# Patient Record
Sex: Female | Born: 1985 | Race: Black or African American | Hispanic: No | Marital: Married | Smoking: Never smoker
Health system: Southern US, Community
[De-identification: ages and names within clinical notes are randomized; demographics above are authoritative.]

## PROBLEM LIST (undated history)

## (undated) ENCOUNTER — Inpatient Hospital Stay (HOSPITAL_COMMUNITY): Payer: Self-pay

## (undated) DIAGNOSIS — E669 Obesity, unspecified: Secondary | ICD-10-CM

## (undated) HISTORY — PX: TONSILLECTOMY: SUR1361

---

## 2004-10-05 ENCOUNTER — Ambulatory Visit (HOSPITAL_COMMUNITY): Admission: RE | Admit: 2004-10-05 | Discharge: 2004-10-05 | Payer: Self-pay | Admitting: Plastic Surgery

## 2004-10-05 ENCOUNTER — Ambulatory Visit (HOSPITAL_BASED_OUTPATIENT_CLINIC_OR_DEPARTMENT_OTHER): Admission: RE | Admit: 2004-10-05 | Discharge: 2004-10-05 | Payer: Self-pay | Admitting: Plastic Surgery

## 2004-10-06 ENCOUNTER — Encounter (INDEPENDENT_AMBULATORY_CARE_PROVIDER_SITE_OTHER): Payer: Self-pay | Admitting: *Deleted

## 2005-02-12 ENCOUNTER — Other Ambulatory Visit: Admission: RE | Admit: 2005-02-12 | Discharge: 2005-02-12 | Payer: Self-pay | Admitting: Obstetrics and Gynecology

## 2009-03-22 ENCOUNTER — Emergency Department (HOSPITAL_COMMUNITY): Admission: EM | Admit: 2009-03-22 | Discharge: 2009-03-22 | Payer: Self-pay | Admitting: Emergency Medicine

## 2010-01-10 ENCOUNTER — Ambulatory Visit (HOSPITAL_BASED_OUTPATIENT_CLINIC_OR_DEPARTMENT_OTHER): Admission: RE | Admit: 2010-01-10 | Discharge: 2010-01-10 | Payer: Self-pay | Admitting: Otolaryngology

## 2010-09-24 HISTORY — PX: OTHER SURGICAL HISTORY: SHX169

## 2011-01-01 LAB — URINALYSIS, ROUTINE W REFLEX MICROSCOPIC
Glucose, UA: NEGATIVE mg/dL
Nitrite: NEGATIVE
Protein, ur: NEGATIVE mg/dL
pH: 6 (ref 5.0–8.0)

## 2011-01-01 LAB — DIFFERENTIAL
Basophils Relative: 0 % (ref 0–1)
Lymphocytes Relative: 15 % (ref 12–46)
Lymphs Abs: 1.3 10*3/uL (ref 0.7–4.0)
Monocytes Absolute: 0.5 10*3/uL (ref 0.1–1.0)
Monocytes Relative: 6 % (ref 3–12)
Neutro Abs: 7.1 10*3/uL (ref 1.7–7.7)
Neutrophils Relative %: 80 % — ABNORMAL HIGH (ref 43–77)

## 2011-01-01 LAB — COMPREHENSIVE METABOLIC PANEL
Albumin: 3.7 g/dL (ref 3.5–5.2)
Alkaline Phosphatase: 126 U/L — ABNORMAL HIGH (ref 39–117)
BUN: 9 mg/dL (ref 6–23)
Calcium: 8.8 mg/dL (ref 8.4–10.5)
Creatinine, Ser: 0.94 mg/dL (ref 0.4–1.2)
Glucose, Bld: 112 mg/dL — ABNORMAL HIGH (ref 70–99)
Total Protein: 7.7 g/dL (ref 6.0–8.3)

## 2011-01-01 LAB — CBC
HCT: 34.2 % — ABNORMAL LOW (ref 36.0–46.0)
Hemoglobin: 10.9 g/dL — ABNORMAL LOW (ref 12.0–15.0)
MCHC: 31.8 g/dL (ref 30.0–36.0)
MCV: 76.1 fL — ABNORMAL LOW (ref 78.0–100.0)
Platelets: 371 10*3/uL (ref 150–400)
RDW: 15.2 % (ref 11.5–15.5)

## 2011-01-01 LAB — POCT PREGNANCY, URINE: Preg Test, Ur: NEGATIVE

## 2011-01-01 LAB — URINE MICROSCOPIC-ADD ON

## 2011-02-09 NOTE — Op Note (Signed)
NAMECAMILIA, Miranda Huynh            ACCOUNT NO.:  [id number]   MEDICAL RECORD NO.:  [id number]          PATIENT TYPE:  AMB   LOCATION:  DSC                          FACILITY:  [facility]   PHYSICIAN:  Consuello Bossier., M.D.DATE OF BIRTH:  October 12, 1985   DATE OF PROCEDURE:  10/05/2004  DATE OF DISCHARGE:                                 OPERATIVE REPORT   PREOPERATIVE DIAGNOSIS:  Symptomatic bilateral mammary hypertrophy.   POSTOPERATIVE DIAGNOSIS:  Symptomatic bilateral mammary hypertrophy.   OPERATION:  Bilateral reduction mammoplasty.   SURGEON:  Pleas Patricia, M.D.   ASSISTANT:  Etter Sjogren, M.D.   ANESTHESIA:  General endotracheal anesthesia.   FINDINGS:  The patient had symptomatic bilateral mammary hypertrophy with  discomfort to her chest, upper shoulder and back area.  A bilateral  reduction mammoplasty was performed removing in excess of 800 g from the  right breast, in excess of 900 g from the left breast, for a total of  approximately 1800 g.   DESCRIPTION OF PROCEDURE:  The patient was brought to the operating room  having been marked in the upright position, supine for the surgical  procedure.  She was given general endotracheal anesthesia, prepped with  Betadine and draped about both dressed in sterile fashion.  Initially, the  key hole area as well as the inferior pedicle were deepithelialized.  An  inframammary incision was made and continued down to the pectoralis major  muscle then upward toward the new nipple position.  Similar procedure was  carried out laterally thereby creating the vertical bipedicle nipple areolar  graft.  An incision in the central pedicle was made just above the current  nipple position and down the pectoralis major muscle then upward, leaving a  1 cm in depth superior pedicle.  A large triangular segment of medial full  thickness breast tissue was removed in continuity with this central segment  as well as with a larger lateral  segment.  Bleeding was controlled with  electrocautery and the wound was irrigated with normal saline and there was  noted to be good hemostasis.  The medial and lateral flaps were brought  together to a predetermined position along the inframammary line with  interrupted 2-0 Vicryl.  The circumareolar, vertical and inframammary  incisions were closed then with interrupted subcutaneous 3-0 Monocryl  followed by a running subcuticular 4-0 Monocryl.  Breasts appeared to be  symmetrical.  Steri-Strips, Xeroform, fluffs, ABD and a circumthoracic Ace  bandage were applied.  The patient tolerated the procedure well and was able  to be discharged from the operating room to the recovery room, subsequently  to be admitted to the [facility] for overnight observation.      Howa   HH/MEDQ  D:  10/05/2004  T:  10/05/2004  Job:  [id number]

## 2011-02-21 ENCOUNTER — Other Ambulatory Visit (HOSPITAL_COMMUNITY): Payer: Self-pay | Admitting: Obstetrics and Gynecology

## 2011-02-21 DIAGNOSIS — N979 Female infertility, unspecified: Secondary | ICD-10-CM

## 2011-02-23 ENCOUNTER — Ambulatory Visit (HOSPITAL_COMMUNITY): Admission: RE | Admit: 2011-02-23 | Payer: Commercial Managed Care - PPO | Source: Ambulatory Visit

## 2011-02-23 ENCOUNTER — Ambulatory Visit (HOSPITAL_COMMUNITY)
Admission: RE | Admit: 2011-02-23 | Discharge: 2011-02-23 | Disposition: A | Discharge status: Home / Self Care | Payer: 59 | Source: Ambulatory Visit | Attending: Obstetrics and Gynecology | Admitting: Obstetrics and Gynecology

## 2011-02-23 DIAGNOSIS — N979 Female infertility, unspecified: Secondary | ICD-10-CM | POA: Insufficient documentation

## 2011-08-05 ENCOUNTER — Encounter (HOSPITAL_COMMUNITY): Payer: Self-pay | Admitting: *Deleted

## 2011-08-05 ENCOUNTER — Inpatient Hospital Stay (HOSPITAL_COMMUNITY)
Admission: AD | Admit: 2011-08-05 | Discharge: 2011-08-05 | Disposition: A | Discharge status: Home / Self Care | Payer: 59 | Source: Ambulatory Visit | Attending: Obstetrics & Gynecology | Admitting: Obstetrics & Gynecology

## 2011-08-05 DIAGNOSIS — O210 Mild hyperemesis gravidarum: Secondary | ICD-10-CM | POA: Insufficient documentation

## 2011-08-05 LAB — CBC
HCT: 36.3 % (ref 36.0–46.0)
Platelets: 425 10*3/uL — ABNORMAL HIGH (ref 150–400)
RBC: 4.9 MIL/uL (ref 3.87–5.11)
RDW: 15.3 % (ref 11.5–15.5)
WBC: 7.7 10*3/uL (ref 4.0–10.5)

## 2011-08-05 LAB — URINALYSIS, ROUTINE W REFLEX MICROSCOPIC
Glucose, UA: NEGATIVE mg/dL
Leukocytes, UA: NEGATIVE
Protein, ur: 30 mg/dL — AB
Specific Gravity, Urine: >1.03 — ABNORMAL HIGH (ref 1.005–1.030)

## 2011-08-05 LAB — COMPREHENSIVE METABOLIC PANEL
AST: 26 U/L (ref 0–37)
Albumin: 3.7 g/dL (ref 3.5–5.2)
Alkaline Phosphatase: 107 U/L (ref 39–117)
BUN: 11 mg/dL (ref 6–23)
CO2: 23 mEq/L (ref 19–32)
Chloride: 99 mEq/L (ref 96–112)
Potassium: 3.7 mEq/L (ref 3.5–5.1)
Total Bilirubin: 0.4 mg/dL (ref 0.3–1.2)

## 2011-08-05 MED ORDER — PROMETHAZINE HCL 25 MG PO TABS: 25.0000 mg | ORAL_TABLET | Freq: Four times a day (QID) | ORAL | Status: AC | PRN

## 2011-08-05 MED ORDER — PROMETHAZINE HCL 25 MG/ML IJ SOLN
25.0000 mg | Freq: Once | INTRAMUSCULAR | Status: AC
  Administered 2011-08-05: 25 mg via INTRAVENOUS
  Filled 2011-08-05: qty 1

## 2011-08-05 MED ORDER — LACTATED RINGERS IV BOLUS (SEPSIS)
1000.0000 mL | Freq: Once | INTRAVENOUS | Status: AC
  Administered 2011-08-05: 1000 mL via INTRAVENOUS

## 2011-08-05 NOTE — Progress Notes (Signed)
Pt states, ' I have been vomiting since Thursday, about fifteen times a day. I throw up from the time I get up to the time I go to bed. I haven't ate since Thursday, and even liquids come straight back up. I feel weak and dizzy."

## 2011-08-05 NOTE — ED Provider Notes (Signed)
History   Pt presents today c/o severe N&V. She states her sx have worsened over the past 3 days and she has vomited about 15x per day. She denies fever, vag dc, bleeding, diarrhea, or any other sx at this time.  Chief Complaint  Patient presents with  . Morning Sickness   HPI  OB History    Grav Para Term Preterm Abortions TAB SAB Ect Mult Living   1               History reviewed. No pertinent past medical history.  Past Surgical History  Procedure Date  . Tonsillectomy     2 years ago  . Other surgical history 2012    R Tubal blockage    History reviewed. No pertinent family history.  History  Substance Use Topics  . Smoking status: Never Smoker   . Smokeless tobacco: Never Used  . Alcohol Use: No    Allergies: No Known Allergies  Prescriptions prior to admission  Medication Sig Dispense Refill  . prenatal vitamin w/FE, FA (PRENATAL 1 + 1) 27-1 MG TABS Take 1 tablet by mouth daily.          Review of Systems  Constitutional: Negative for fever.  Cardiovascular: Negative for chest pain and palpitations.  Gastrointestinal: Positive for nausea and vomiting. Negative for abdominal pain, diarrhea and constipation.  Genitourinary: Negative for dysuria, urgency, frequency and hematuria.  Neurological: Negative for dizziness and headaches.  Psychiatric/Behavioral: Negative for depression and suicidal ideas.   Physical Exam   Blood pressure 125/76, pulse 87, temperature 98.4 F (36.9 C), temperature source Oral, resp. rate 20, height 6' 7.25" (2.013 m), weight 275 lb 2 oz (124.796 kg), last menstrual period 06/17/2011.  Physical Exam  Nursing note and vitals reviewed. Constitutional: She is oriented to person, place, and time. She appears well-developed and well-nourished. No distress.  HENT:  Head: Normocephalic and atraumatic.  Eyes: EOM are normal. Pupils are equal, round, and reactive to light.  GI: Soft. She exhibits no distension. There is no tenderness.  There is no rebound and no guarding.  Neurological: She is alert and oriented to person, place, and time.  Skin: Skin is warm and dry. She is not diaphoretic.  Psychiatric: She has a normal mood and affect. Her behavior is normal. Judgment and thought content normal.    MAU Course  Procedures  Results for orders placed during the hospital encounter of 08/05/11 (from the past 24 hour(s))  CBC     Status: Abnormal   Collection Time   08/05/11 12:39 AM      Component Value Range   WBC 7.7  4.0 - 10.5 (K/uL)   RBC 4.90  3.87 - 5.11 (MIL/uL)   Hemoglobin 11.7 (*) 12.0 - 15.0 (g/dL)   HCT 16.1  09.6 - 04.5 (%)   MCV 74.1 (*) 78.0 - 100.0 (fL)   MCH 23.9 (*) 26.0 - 34.0 (pg)   MCHC 32.2  30.0 - 36.0 (g/dL)   RDW 40.9  81.1 - 91.4 (%)   Platelets 425 (*) 150 - 400 (K/uL)  COMPREHENSIVE METABOLIC PANEL     Status: Abnormal   Collection Time   08/05/11 12:39 AM      Component Value Range   Sodium 134 (*) 135 - 145 (mEq/L)   Potassium 3.7  3.5 - 5.1 (mEq/L)   Chloride 99  96 - 112 (mEq/L)   CO2 23  19 - 32 (mEq/L)   Glucose, Bld 84  70 -  99 (mg/dL)   BUN 11  6 - 23 (mg/dL)   Creatinine, Ser 1.61  0.50 - 1.10 (mg/dL)   Calcium 9.6  8.4 - 09.6 (mg/dL)   Total Protein 8.8 (*) 6.0 - 8.3 (g/dL)   Albumin 3.7  3.5 - 5.2 (g/dL)   AST 26  0 - 37 (U/L)   ALT 39 (*) 0 - 35 (U/L)   Alkaline Phosphatase 107  39 - 117 (U/L)   Total Bilirubin 0.4  0.3 - 1.2 (mg/dL)   GFR calc non Af Amer >90  >90 (mL/min)   GFR calc Af Amer >90  >90 (mL/min)  URINALYSIS, ROUTINE W REFLEX MICROSCOPIC     Status: Abnormal   Collection Time   08/05/11 12:41 AM      Component Value Range   Color, Urine YELLOW  YELLOW    Appearance CLEAR  CLEAR    Specific Gravity, Urine >1.030 (*) 1.005 - 1.030    pH 6.0  5.0 - 8.0    Glucose, UA NEGATIVE  NEGATIVE (mg/dL)   Hgb urine dipstick NEGATIVE  NEGATIVE    Bilirubin Urine SMALL (*) NEGATIVE    Ketones, ur 40 (*) NEGATIVE (mg/dL)   Protein, ur 30 (*) NEGATIVE  (mg/dL)   Urobilinogen, UA 0.2  0.0 - 1.0 (mg/dL)   Nitrite NEGATIVE  NEGATIVE    Leukocytes, UA NEGATIVE  NEGATIVE   URINE MICROSCOPIC-ADD ON     Status: Abnormal   Collection Time   08/05/11 12:41 AM      Component Value Range   Squamous Epithelial / LPF FEW (*) RARE    WBC, UA 0-2  <3 (WBC/hpf)   RBC / HPF 0-2  <3 (RBC/hpf)   Bacteria, UA FEW (*) RARE    Pt sx resolved following IV hydration and antiemetics.  Assessment and Plan  Hyperemesis: discussed with pt at length. Will dc with phenergan. Discussed diet, activity, risks, and precautions.  Clinton Gallant. Akeia Perot III, DrHSc, MPAS, PA-C  08/05/2011, 1:34 AM   Henrietta Hoover, PA 08/05/11 0308

## 2011-08-06 LAB — POCT PREGNANCY, URINE: Preg Test, Ur: POSITIVE

## 2011-08-14 ENCOUNTER — Inpatient Hospital Stay (HOSPITAL_COMMUNITY)
Admission: AD | Admit: 2011-08-14 | Discharge: 2011-08-14 | Disposition: A | Discharge status: Home / Self Care | Payer: 59 | Source: Ambulatory Visit | Attending: Obstetrics and Gynecology | Admitting: Obstetrics and Gynecology

## 2011-08-14 ENCOUNTER — Encounter (HOSPITAL_COMMUNITY): Payer: Self-pay | Admitting: *Deleted

## 2011-08-14 DIAGNOSIS — O210 Mild hyperemesis gravidarum: Secondary | ICD-10-CM | POA: Insufficient documentation

## 2011-08-14 DIAGNOSIS — R1110 Vomiting, unspecified: Secondary | ICD-10-CM

## 2011-08-14 LAB — URINALYSIS, ROUTINE W REFLEX MICROSCOPIC
Leukocytes, UA: NEGATIVE
Nitrite: NEGATIVE
Specific Gravity, Urine: 1.025 (ref 1.005–1.030)
pH: 6 (ref 5.0–8.0)

## 2011-08-14 LAB — URINE MICROSCOPIC-ADD ON

## 2011-08-14 MED ORDER — METHYLPREDNISOLONE 4 MG PO KIT: PACK | ORAL | Status: AC

## 2011-08-14 MED ORDER — SCOPOLAMINE 1 MG/3DAYS TD PT72
1.0000 | MEDICATED_PATCH | Freq: Once | TRANSDERMAL | Status: DC
  Administered 2011-08-14: 1.5 mg via TRANSDERMAL
  Filled 2011-08-14: qty 1

## 2011-08-14 MED ORDER — ONDANSETRON HCL 4 MG/2ML IJ SOLN
4.0000 mg | Freq: Once | INTRAMUSCULAR | Status: AC
  Administered 2011-08-14: 4 mg via INTRAVENOUS
  Filled 2011-08-14: qty 2

## 2011-08-14 MED ORDER — FAMOTIDINE IN NACL 20-0.9 MG/50ML-% IV SOLN
20.0000 mg | Freq: Once | INTRAVENOUS | Status: AC
  Administered 2011-08-14: 20 mg via INTRAVENOUS
  Filled 2011-08-14: qty 50

## 2011-08-14 MED ORDER — LACTATED RINGERS IV SOLN
10.0000 mL | Freq: Once | INTRAVENOUS | Status: AC
  Administered 2011-08-14: 10 mL via INTRAVENOUS
  Filled 2011-08-14: qty 10

## 2011-08-14 MED ORDER — PROMETHAZINE HCL 25 MG/ML IJ SOLN
25.0000 mg | Freq: Once | INTRAMUSCULAR | Status: AC
  Administered 2011-08-14: 25 mg via INTRAVENOUS
  Filled 2011-08-14: qty 1

## 2011-08-14 MED ORDER — SCOPOLAMINE 1 MG/3DAYS TD PT72: 1.0000 | MEDICATED_PATCH | TRANSDERMAL | Status: DC

## 2011-08-14 NOTE — Progress Notes (Signed)
Spoke with provider S. Shores, CNM and she wishes for zofran to be given just prior to finishing las bag of fluid.

## 2011-08-14 NOTE — Progress Notes (Signed)
Patient was seen in MAU 11-11 and given IV fluids for N/V. Has Zofran and Phenergan at home but are not working. Had two days without vomiting since last MAU visit. Unable to keep anything down.

## 2011-08-14 NOTE — ED Provider Notes (Signed)
Chief Complaint:  Emesis   Miranda Huynh is  25 y.o. G2P0010.  Patient's last menstrual period was 06/17/2011.Marland Kitchen  Her pregnancy status is positive. [redacted]w[redacted]d  She presents complaining of Emesis . Increased vomiting today despise taking medications as directed. Reports vomiting ~ 6 times today. Denies fever, chills, abd pain, diarrhea, or dizziness. Last Zofran at noon today. Unable to tolerate anything po. Reports last episode of vomiting < 1 hour ago.   Obstetrical/Gynecological History: OB History    Grav Para Term Preterm Abortions TAB SAB Ect Mult Living   2 0 0 0 1 0 1 0 0 0       Past Medical History: History reviewed. No pertinent past medical history.  Past Surgical History: Past Surgical History  Procedure Date  . Tonsillectomy     2 years ago  . Other surgical history 2012    R Tubal blockage    Family History: Family History  Problem Relation Age of Onset  . Cancer Maternal Grandmother     Social History: History  Substance Use Topics  . Smoking status: Never Smoker   . Smokeless tobacco: Never Used  . Alcohol Use: No    Allergies: No Known Allergies  Prescriptions prior to admission  Medication Sig Dispense Refill  . prenatal vitamin w/FE, FA (PRENATAL 1 + 1) 27-1 MG TABS Take 1 tablet by mouth daily.          Review of Systems - Negative except what has been reviewed in the HPI  Physical Exam   Blood pressure 143/83, pulse 91, temperature 98.7 F (37.1 C), temperature source Oral, resp. rate 20, height 5\' 8"  (1.727 m), weight 122.38 kg (269 lb 12.8 oz), last menstrual period 06/17/2011, SpO2 99.00%.  General: General appearance - alert, well appearing, and in no distress, oriented to person, place, and time and overweight Abdomen - soft, nontender, nondistended, no masses or organomegaly Focused Gynecological Exam: examination not indicated  Labs: Recent Results (from the past 24 hour(s))  URINALYSIS, ROUTINE W REFLEX MICROSCOPIC   Collection Time     08/14/11  5:30 PM      Component Value Range   Color, Urine YELLOW  YELLOW    Appearance CLEAR  CLEAR    Specific Gravity, Urine 1.025  1.005 - 1.030    pH 6.0  5.0 - 8.0    Glucose, UA NEGATIVE  NEGATIVE (mg/dL)   Hgb urine dipstick NEGATIVE  NEGATIVE    Bilirubin Urine MODERATE (*) NEGATIVE    Ketones, ur >80 (*) NEGATIVE (mg/dL)   Protein, ur 30 (*) NEGATIVE (mg/dL)   Urobilinogen, UA 2.0 (*) 0.0 - 1.0 (mg/dL)   Nitrite NEGATIVE  NEGATIVE    Leukocytes, UA NEGATIVE  NEGATIVE   URINE MICROSCOPIC-ADD ON   Collection Time   08/14/11  5:30 PM      Component Value Range   Squamous Epithelial / LPF RARE  RARE    WBC, UA 0-2  <3 (WBC/hpf)   RBC / HPF 0-2  <3 (RBC/hpf)   Bacteria, UA MANY (*) RARE    Urine-Other MUCOUS PRESENT     MD Consult: IVF per protocol, discharge home with scope patch and medrol taper. Tolerating ice chips  Assessment: Hyperemesis   Plan: Rx medrol taper and scope patch given FU in office as scheduled  SHORES,SUZANNE E. 08/14/2011,6:59 PM   Patient discharged to home to follow up in the office.  [place], NP 08/14/11 2200

## 2011-08-14 NOTE — ED Notes (Signed)
States was recently changed to Zofran from Phenergan. States it is the tablets and she can not keep them down. Called MD office, was advised to come to MAU.

## 2011-08-28 ENCOUNTER — Encounter (HOSPITAL_COMMUNITY): Payer: Self-pay | Admitting: *Deleted

## 2011-08-28 ENCOUNTER — Inpatient Hospital Stay (HOSPITAL_COMMUNITY)
Admission: AD | Admit: 2011-08-28 | Discharge: 2011-08-28 | Disposition: A | Discharge status: Home / Self Care | Payer: 59 | Source: Ambulatory Visit | Attending: Obstetrics and Gynecology | Admitting: Obstetrics and Gynecology

## 2011-08-28 DIAGNOSIS — O210 Mild hyperemesis gravidarum: Secondary | ICD-10-CM

## 2011-08-28 DIAGNOSIS — O211 Hyperemesis gravidarum with metabolic disturbance: Secondary | ICD-10-CM | POA: Insufficient documentation

## 2011-08-28 DIAGNOSIS — E860 Dehydration: Secondary | ICD-10-CM | POA: Insufficient documentation

## 2011-08-28 HISTORY — DX: Obesity, unspecified: E66.9

## 2011-08-28 LAB — URINE MICROSCOPIC-ADD ON

## 2011-08-28 LAB — COMPREHENSIVE METABOLIC PANEL
ALT: 46 U/L — ABNORMAL HIGH (ref 0–35)
AST: 24 U/L (ref 0–37)
Albumin: 4 g/dL (ref 3.5–5.2)
CO2: 19 mEq/L (ref 19–32)
Calcium: 11.1 mg/dL — ABNORMAL HIGH (ref 8.4–10.5)
Creatinine, Ser: 0.88 mg/dL (ref 0.50–1.10)
Sodium: 137 mEq/L (ref 135–145)
Total Protein: 9.2 g/dL — ABNORMAL HIGH (ref 6.0–8.3)

## 2011-08-28 LAB — URINALYSIS, ROUTINE W REFLEX MICROSCOPIC
Ketones, ur: >80 mg/dL — AB
Nitrite: NEGATIVE
pH: 6 (ref 5.0–8.0)

## 2011-08-28 MED ORDER — DEXTROSE 5 % IN LACTATED RINGERS IV BOLUS
1000.0000 mL | Freq: Once | INTRAVENOUS | Status: AC
  Administered 2011-08-28: 1000 mL via INTRAVENOUS

## 2011-08-28 MED ORDER — PROMETHAZINE HCL 12.5 MG PO TABS: 12.5000 mg | ORAL_TABLET | Freq: Four times a day (QID) | ORAL | Status: DC | PRN

## 2011-08-28 MED ORDER — ONDANSETRON 4 MG PO TBDP
4.0000 mg | ORAL_TABLET | Freq: Once | ORAL | Status: AC
  Administered 2011-08-28: 4 mg via ORAL
  Filled 2011-08-28: qty 1

## 2011-08-28 MED ORDER — M.V.I. ADULT IV INJ
10.0000 mL | Freq: Once | INTRAVENOUS | Status: DC
  Administered 2011-08-28: 10 mL via INTRAVENOUS

## 2011-08-28 MED ORDER — DEXTROSE IN LACTATED RINGERS 5 % IV SOLN
25.0000 mg | Freq: Once | INTRAVENOUS | Status: AC
  Administered 2011-08-28: 25 mg via INTRAVENOUS
  Filled 2011-08-28: qty 1

## 2011-08-28 MED ORDER — PROMETHAZINE HCL 25 MG/ML IJ SOLN
Freq: Once | INTRAVENOUS | Status: AC
  Administered 2011-08-28: 03:00:00 via INTRAVENOUS
  Filled 2011-08-28: qty 1000

## 2011-08-28 MED ORDER — PROMETHAZINE HCL 25 MG/ML IJ SOLN: 25.0000 mg | INTRAVENOUS | Status: DC

## 2011-08-28 NOTE — ED Provider Notes (Signed)
Miranda Powell25 y.o.G2P0010 @[redacted]w[redacted]d   WJ:[id number]  SUBJECTIVE  HPI: This is the third MAU visit for hyperemesis. She vomits several times every day and has vomited since her arrival here. She has a supply of Phenergan and Zofran tablets but has been unable to retain anything but fluids for about a week. She went to stay with her mother and left her scopolamine patches so has not used that today. Feels weak and tired but denies orthostatic symptoms, fever, diarrhea. Has been constipated for about a week.  No abd pain or vaginal bleeding. No prior hx BP elevations.  Past Medical History  Diagnosis Date  . No pertinent past medical history   . Obesity    Past Surgical History  Procedure Date  . Tonsillectomy     2 years ago  . Other surgical history 2012    R Tubal blockage  . Salpingoophorectomy tube unblocked   History   Social History  . Marital Status: Married    Spouse Name: N/A    Number of Children: N/A  . Years of Education: N/A   Occupational History  . Not on file.   Social History Main Topics  . Smoking status: Never Smoker   . Smokeless tobacco: Never Used  . Alcohol Use: No  . Drug Use: No  . Sexually Active: Yes   Other Topics Concern  . Not on file   Social History Narrative  . No narrative on file   No current facility-administered medications on file prior to encounter.   Current Outpatient Prescriptions on File Prior to Encounter  Medication Sig Dispense Refill  . prenatal vitamin w/FE, FA (PRENATAL 1 + 1) 27-1 MG TABS Take 1 tablet by mouth daily.        Marland Kitchen scopolamine (TRANSDERM-SCOP) 1.5 MG Place 1 patch (1.5 mg total) onto the skin every 3 (three) days.  10 patch  12   No Known Allergies  ROS: Pertinent items in HPI  OBJECTIVE  BP 141/95  Pulse 130  Temp(Src) 97.8 F (36.6 C) (Oral)  Resp 18  Ht 5\' 7"  (1.702 m)  Wt 115.214 kg (254 lb)  BMI 39.78 kg/m2  LMP 06/17/2011  Prior visit 269#, initial visit 275 Orthostatic VS: pulse  increased 100-133 lying to standing, BP stable BPs 123-149/69-95, most recent 149/94  Physical Exam  Constitutional: She is oriented to person, place, and time. She appears distressed.       Appears listless  HENT:  Head: Normocephalic.  Neck: Neck supple.  Cardiovascular: Normal rate.   Pulmonary/Chest: Effort normal.  Abdominal: Soft. She exhibits no distension. There is no tenderness.  Musculoskeletal: Normal range of motion.  Neurological: She is alert and oriented to person, place, and time.  Skin: Skin is warm and dry.  Psychiatric: Affect normal.   Results for orders placed during the hospital encounter of 08/28/11 (from the past 24 hour(s))  URINALYSIS, ROUTINE W REFLEX MICROSCOPIC     Status: Abnormal   Collection Time   08/28/11  1:00 AM      Component Value Range   Color, Urine YELLOW  YELLOW    APPearance HAZY (*) CLEAR    Specific Gravity, Urine >1.030 (*) 1.005 - 1.030    pH 6.0  5.0 - 8.0    Glucose, UA NEGATIVE  NEGATIVE (mg/dL)   Hgb urine dipstick SMALL (*) NEGATIVE    Bilirubin Urine MODERATE (*) NEGATIVE    Ketones, ur >80 (*) NEGATIVE (mg/dL)   Protein, ur 562 (*) NEGATIVE (mg/dL)  Urobilinogen, UA 2.0 (*) 0.0 - 1.0 (mg/dL)   Nitrite NEGATIVE  NEGATIVE    Leukocytes, UA TRACE (*) NEGATIVE   URINE MICROSCOPIC-ADD ON     Status: Abnormal   Collection Time   08/28/11  1:00 AM      Component Value Range   Squamous Epithelial / LPF FEW (*) RARE    WBC, UA 11-20  <3 (WBC/hpf)   RBC / HPF 0-2  <3 (RBC/hpf)   Bacteria, UA FEW (*) RARE    Casts HYALINE CASTS (*) NEGATIVE    Urine-Other MUCOUS PRESENT    COMPREHENSIVE METABOLIC PANEL     Status: Abnormal   Collection Time   08/28/11  1:50 AM      Component Value Range   Sodium 137  135 - 145 (mEq/L)   Potassium 3.6  3.5 - 5.1 (mEq/L)   Chloride 96  96 - 112 (mEq/L)   CO2 19  19 - 32 (mEq/L)   Glucose, Bld 108 (*) 70 - 99 (mg/dL)   BUN 11  6 - 23 (mg/dL)   Creatinine, Ser 1.61  0.50 - 1.10 (mg/dL)    Calcium 09.6 (*) 8.4 - 10.5 (mg/dL)   Total Protein 9.2 (*) 6.0 - 8.3 (g/dL)   Albumin 4.0  3.5 - 5.2 (g/dL)   AST 24  0 - 37 (U/L)   ALT 46 (*) 0 - 35 (U/L)   Alkaline Phosphatase 122 (*) 39 - 117 (U/L)   Total Bilirubin 0.7  0.3 - 1.2 (mg/dL)   GFR calc non Af Amer 89 (*) >90 (mL/min)   GFR calc Af Amer >90  >90 (mL/min)    MAU Course: Zofran 4mg  sl, IV#1 D5LR 1000 w/ Phenergan 25 mg -> still vomiting, IV#2 LR1000 w/ Phenergan 25 mg and MVI-> improved with no more vomiting. Sleeping.  ASSESSMENT   Hyperemesis with dehydration and 10% weight loss   PLAN  C/W Dr. Claiborne Billings ->discharge if improved after 2nd bag of fluids. Keep office appointment in 3 days Rx Phenergan suppositories

## 2011-08-28 NOTE — Progress Notes (Signed)
Pt c/o N&V for the past two weeks.  Says she has phenergan and zofran prescribed and they don't work.  States she also has a bad odor to her discharge for the past two weeks.

## 2011-08-30 ENCOUNTER — Inpatient Hospital Stay (HOSPITAL_COMMUNITY)
Admission: AD | Admit: 2011-08-30 | Discharge: 2011-09-02 | DRG: 781 | Disposition: A | Discharge status: Home / Self Care | Payer: 59 | Source: Ambulatory Visit | Attending: Obstetrics and Gynecology | Admitting: Obstetrics and Gynecology

## 2011-08-30 DIAGNOSIS — E876 Hypokalemia: Secondary | ICD-10-CM | POA: Diagnosis present

## 2011-08-30 DIAGNOSIS — O210 Mild hyperemesis gravidarum: Principal | ICD-10-CM | POA: Diagnosis present

## 2011-08-30 NOTE — Progress Notes (Signed)
Pt 10.4wks having nausea and vomiting.  Vomiting 15x today.  Reports SOB after vomiting.

## 2011-08-31 ENCOUNTER — Inpatient Hospital Stay (HOSPITAL_COMMUNITY): Payer: 59

## 2011-08-31 ENCOUNTER — Encounter (HOSPITAL_COMMUNITY): Payer: Self-pay | Admitting: *Deleted

## 2011-08-31 DIAGNOSIS — O210 Mild hyperemesis gravidarum: Secondary | ICD-10-CM | POA: Diagnosis present

## 2011-08-31 LAB — COMPREHENSIVE METABOLIC PANEL
ALT: 88 U/L — ABNORMAL HIGH (ref 0–35)
Albumin: 3.6 g/dL (ref 3.5–5.2)
Alkaline Phosphatase: 125 U/L — ABNORMAL HIGH (ref 39–117)
Alkaline Phosphatase: 106 U/L (ref 39–117)
BUN: 10 mg/dL (ref 6–23)
BUN: 8 mg/dL (ref 6–23)
Calcium: 9.7 mg/dL (ref 8.4–10.5)
Chloride: 99 mEq/L (ref 96–112)
Creatinine, Ser: 0.73 mg/dL (ref 0.50–1.10)
GFR calc Af Amer: >90 mL/min (ref >90)
GFR calc Af Amer: >90 mL/min (ref >90)
GFR calc non Af Amer: >90 mL/min (ref >90)
Glucose, Bld: 99 mg/dL (ref 70–99)
Glucose, Bld: 93 mg/dL (ref 70–99)
Potassium: 2.8 mEq/L — ABNORMAL LOW (ref 3.5–5.1)
Potassium: 2.9 mEq/L — ABNORMAL LOW (ref 3.5–5.1)
Sodium: 134 mEq/L — ABNORMAL LOW (ref 135–145)
Total Bilirubin: 1 mg/dL (ref 0.3–1.2)
Total Protein: 8.5 g/dL — ABNORMAL HIGH (ref 6.0–8.3)

## 2011-08-31 LAB — URINE MICROSCOPIC-ADD ON

## 2011-08-31 LAB — TSH: TSH: 0.13 u[IU]/mL — ABNORMAL LOW (ref 0.350–4.500)

## 2011-08-31 LAB — LIPASE, BLOOD: Lipase: 46 U/L (ref 11–59)

## 2011-08-31 LAB — URINALYSIS, ROUTINE W REFLEX MICROSCOPIC
Specific Gravity, Urine: >1.03 — ABNORMAL HIGH (ref 1.005–1.030)
Urobilinogen, UA: 2 mg/dL — ABNORMAL HIGH (ref 0.0–1.0)
pH: 6 (ref 5.0–8.0)

## 2011-08-31 LAB — CBC
HCT: 37.3 % (ref 36.0–46.0)
Hemoglobin: 12.4 g/dL (ref 12.0–15.0)
MCV: 72.6 fL — ABNORMAL LOW (ref 78.0–100.0)
WBC: 6.3 10*3/uL (ref 4.0–10.5)

## 2011-08-31 LAB — AMYLASE: Amylase: 102 U/L (ref 0–105)

## 2011-08-31 IMAGING — US US OB COMP LESS 14 WK
1 series · 13 of 28 positions shown · non-contrast
Comparison: none

[Series 1: us ob comp less 14 wks · 35 acquisitions, 13 frames shown]
[im 2/35]
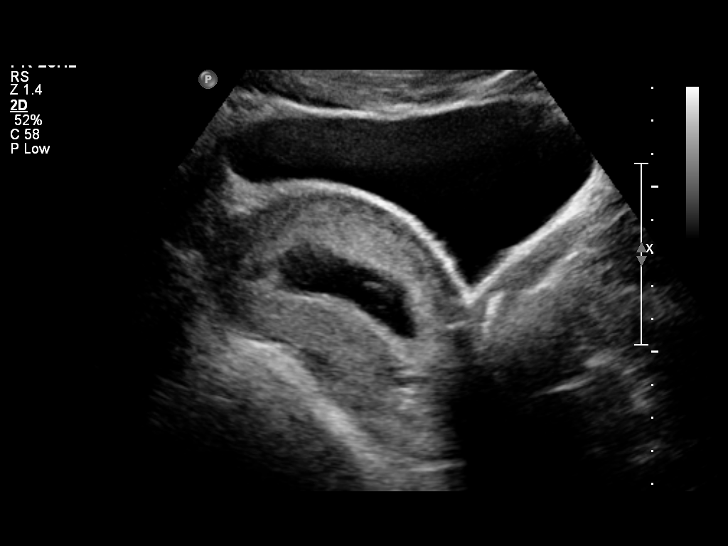
[im 4/35]
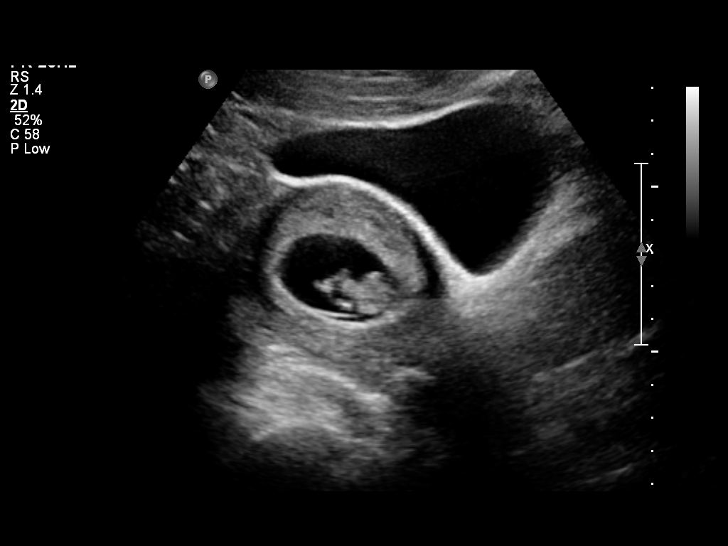
[im 7/35]
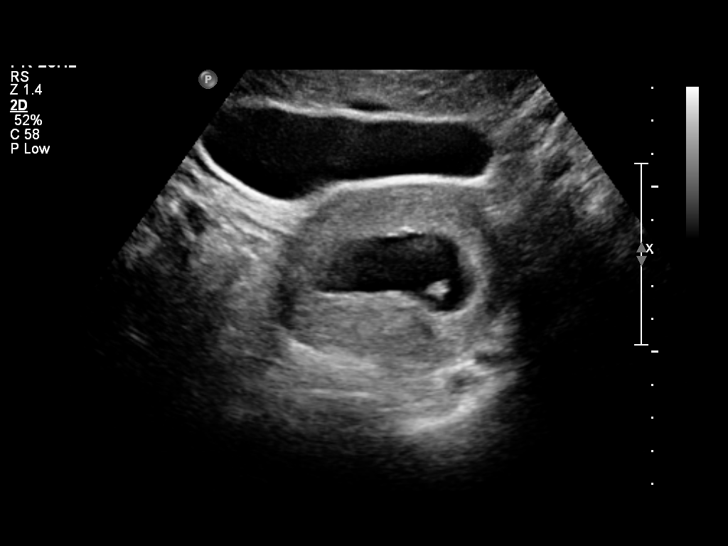
[im 9/35]
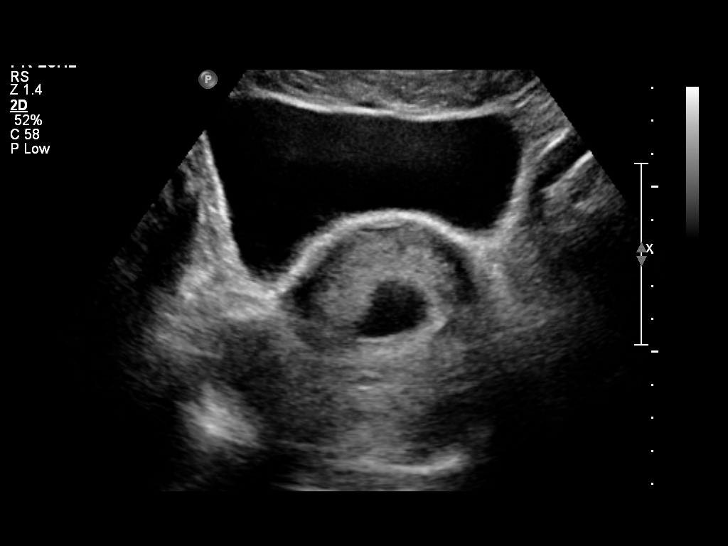
[im 12/35]
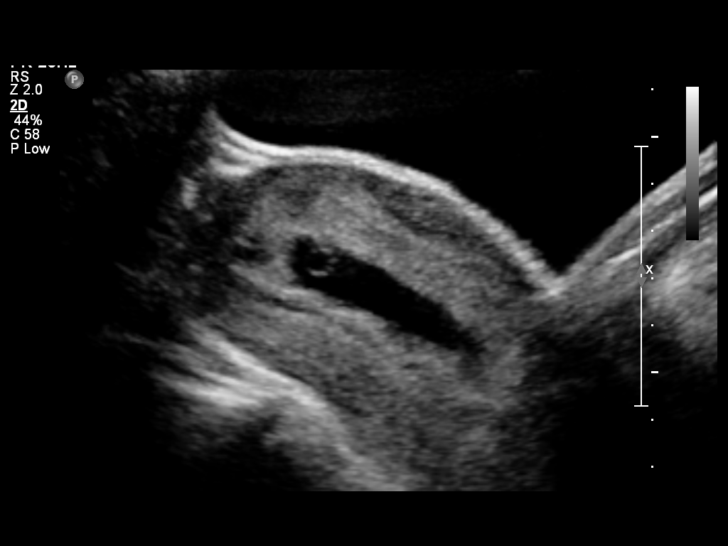
[im 14/35]
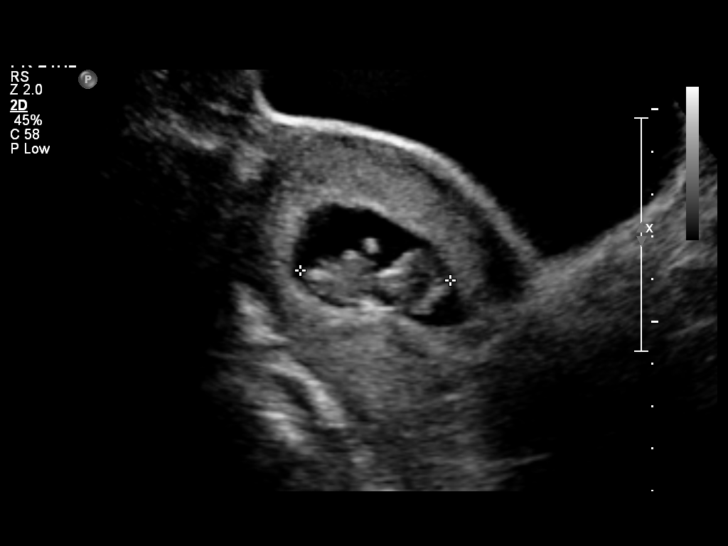
[im 18/35]
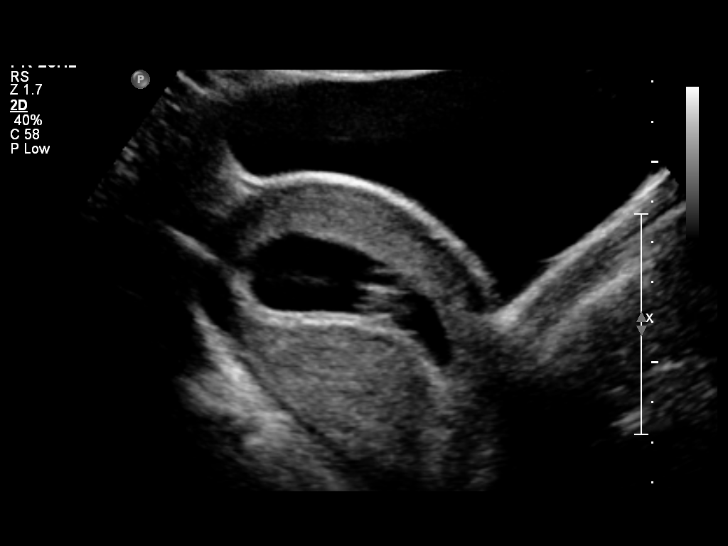
[im 21/35]
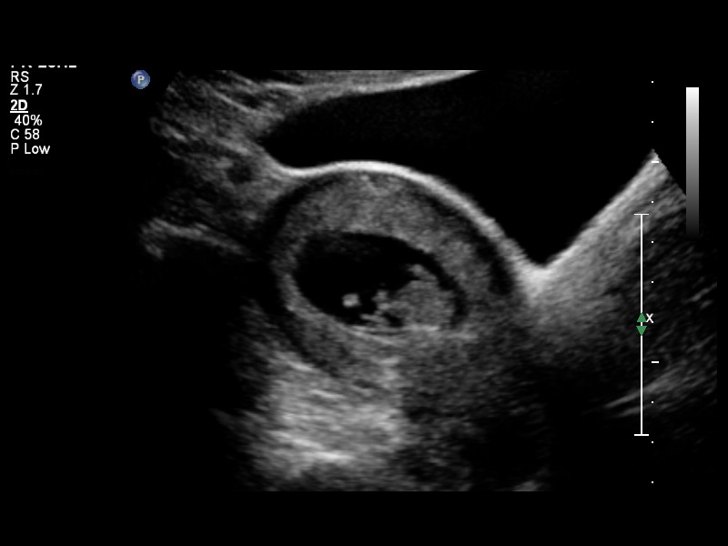
[im 23/35]
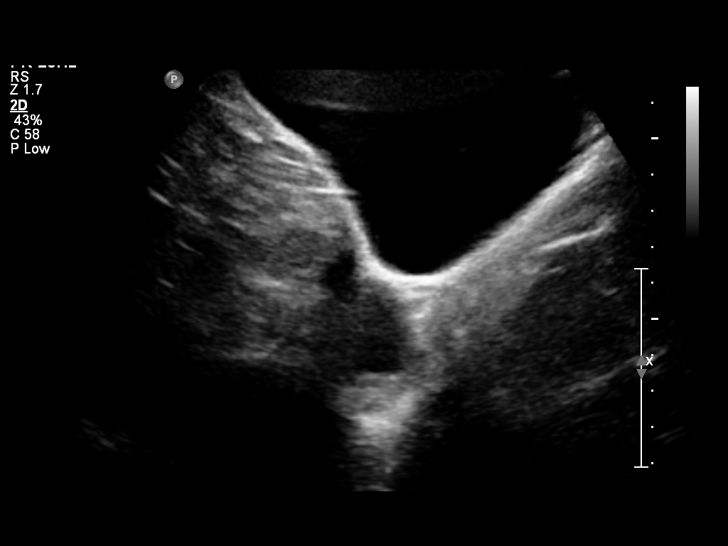
[im 26/35]
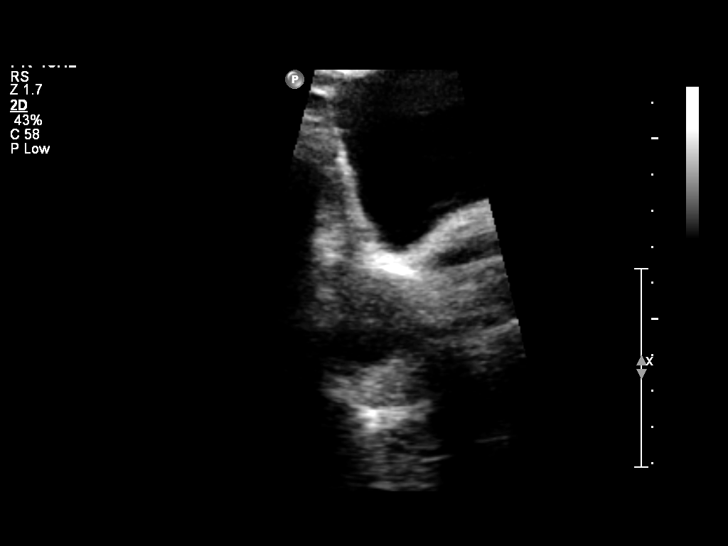
[im 28/35]
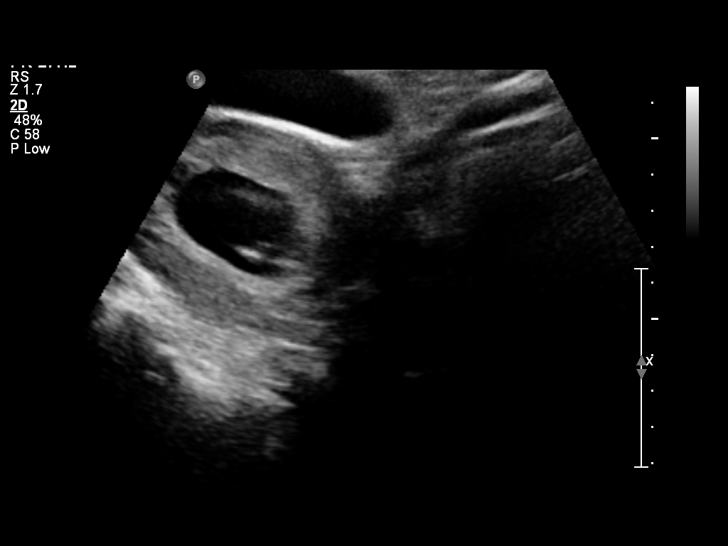
[im 31/35]
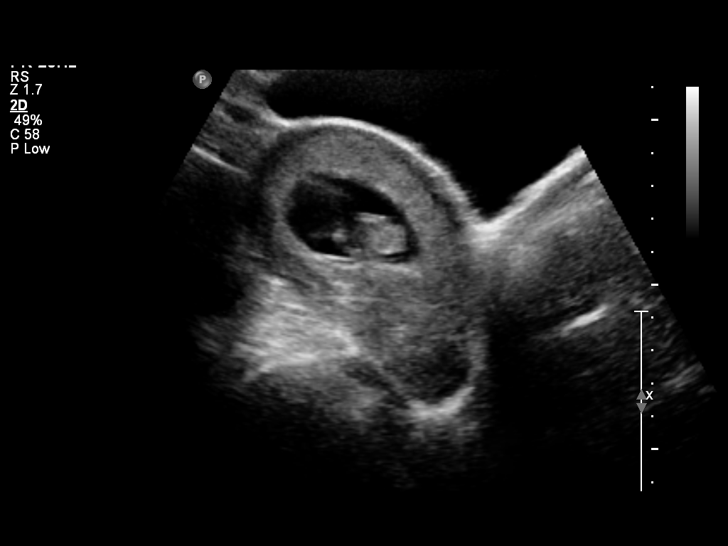
[im 33/35]
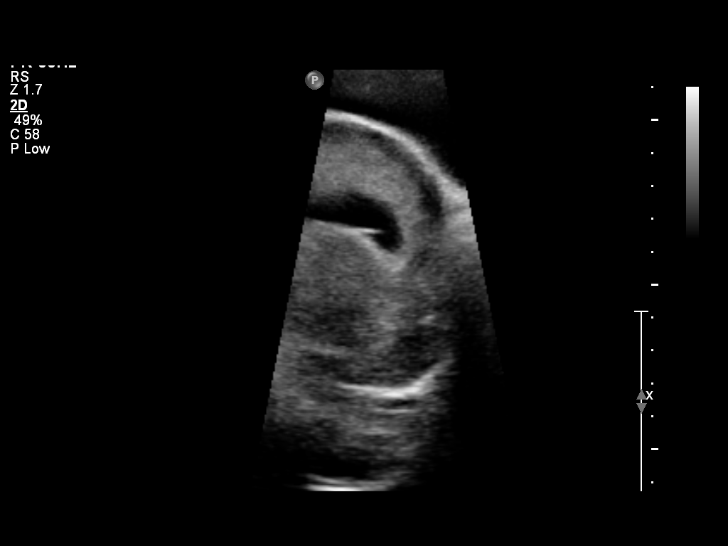

[13 of 28 positions shown; findings below may reference images not displayed]

OBSTETRICS REPORT
                      (Signed Final [DATE] [DATE])

 Order#:         [PHONE_NUMBER]_I
Procedures

 US OB COMP LESS 14 WKS                                76801.0
Indications

 Hyperemesis gravidarum
 Assess viability
Fetal Evaluation

 Preg. Location:    Intrauterine
 Gest. Sac:         Intrauterine
 Yolk Sac:          Visualized
 Fetal Pole:        Visualized
 Fetal Heart Rate:  160                         bpm
 Cardiac Activity:  Observed
Biometry

 CRL:     34.8  mm    G. Age:   10w 2d                 EDD:   [DATE]
Gestational Age

 LMP:           10w 5d       Date:   [DATE]                 EDD:   [DATE]
 Best:          10w 5d    Det. By:   LMP  ([DATE])          EDD:   [DATE]
Cervix Uterus Adnexa

 Cervix:       Closed.
 Uterus:       No abnormality visualized.
 Cul De Sac:   No free fluid seen.

 Left Ovary:   Not visualized
 Right Ovary:  Within normal limits measuring 2.8 x 3.6 x 2.9 cm.
               Small corpus luteum noted.
 Adnexa:     No abnormality visualized.
Impression

 There is a single living intrauterine pregancy demonstrating
 an EGA by CRL of 10w 2d . This correlates well with
 expected EGA by of 10w 5d  . Normal right ovary,
 nonvisualized left ovary.

## 2011-08-31 MED ORDER — KCL-LACTATED RINGERS-D5W 20 MEQ/L IV SOLN
INTRAVENOUS | Status: DC
  Filled 2011-08-31 (×2): qty 1000

## 2011-08-31 MED ORDER — ONDANSETRON HCL 4 MG/2ML IJ SOLN
4.0000 mg | Freq: Once | INTRAMUSCULAR | Status: AC
  Administered 2011-08-31: 4 mg via INTRAVENOUS
  Filled 2011-08-31: qty 2

## 2011-08-31 MED ORDER — PROMETHAZINE HCL 25 MG/ML IJ SOLN
25.0000 mg | Freq: Once | INTRAMUSCULAR | Status: AC
  Administered 2011-08-31: 25 mg via INTRAVENOUS
  Filled 2011-08-31: qty 1

## 2011-08-31 MED ORDER — DEXTROSE IN LACTATED RINGERS 5 % IV SOLN
INTRAVENOUS | Status: DC
  Administered 2011-08-31 – 2011-09-01 (×3): via INTRAVENOUS
  Filled 2011-08-31 (×5): qty 1000

## 2011-08-31 MED ORDER — SCOPOLAMINE 1 MG/3DAYS TD PT72
1.0000 | MEDICATED_PATCH | TRANSDERMAL | Status: DC
  Administered 2011-08-31: 1.5 mg via TRANSDERMAL
  Filled 2011-08-31 (×2): qty 1

## 2011-08-31 MED ORDER — LACTATED RINGERS IV SOLN
INTRAVENOUS | Status: DC
  Administered 2011-08-31: 03:00:00 via INTRAVENOUS

## 2011-08-31 MED ORDER — DEXTROSE 5 % IN LACTATED RINGERS IV BOLUS
1000.0000 mL | Freq: Once | INTRAVENOUS | Status: AC
  Administered 2011-08-31: 1000 mL via INTRAVENOUS

## 2011-08-31 MED ORDER — FAMOTIDINE IN NACL 20-0.9 MG/50ML-% IV SOLN
20.0000 mg | Freq: Two times a day (BID) | INTRAVENOUS | Status: DC
  Administered 2011-08-31 – 2011-09-02 (×5): 20 mg via INTRAVENOUS
  Filled 2011-08-31 (×6): qty 50

## 2011-08-31 MED ORDER — POTASSIUM CHLORIDE 2 MEQ/ML IV SOLN
Freq: Once | INTRAVENOUS | Status: AC
  Administered 2011-08-31: 02:00:00 via INTRAVENOUS
  Filled 2011-08-31: qty 1000

## 2011-08-31 NOTE — Progress Notes (Signed)
UR Chart review completed.  

## 2011-08-31 NOTE — ED Provider Notes (Signed)
History     Chief Complaint  Patient presents with  . Morning Sickness   HPI 24 y.o. G2P0010 at [redacted]w[redacted]d with ongoing nausea and vomiting. States she vomited 15 times today. Hasn't tried to eat in 2 days. Tried Zofran and Phenergan in the past, states they did not work so she stopped taking them.     Past Medical History  Diagnosis Date  . No pertinent past medical history   . Obesity     Past Surgical History  Procedure Date  . Tonsillectomy     2 years ago  . Other surgical history 2012    R Tubal blockage  . Salpingoophorectomy tube unblocked    Family History  Problem Relation Age of Onset  . Cancer Maternal Grandmother     History  Substance Use Topics  . Smoking status: Never Smoker   . Smokeless tobacco: Never Used  . Alcohol Use: No    Allergies: No Known Allergies  Prescriptions prior to admission  Medication Sig Dispense Refill  . prenatal vitamin w/FE, FA (PRENATAL 1 + 1) 27-1 MG TABS Take 1 tablet by mouth daily.        Marland Kitchen scopolamine (TRANSDERM-SCOP) 1.5 MG Place 1 patch (1.5 mg total) onto the skin every 3 (three) days.  10 patch  12    Review of Systems  Constitutional: Positive for malaise/fatigue.  Respiratory: Negative.   Cardiovascular: Negative.   Gastrointestinal: Positive for nausea and vomiting. Negative for abdominal pain, diarrhea and constipation.  Genitourinary: Negative for dysuria, urgency, frequency, hematuria and flank pain.       Negative for vaginal bleeding, vaginal discharge  Musculoskeletal: Negative.   Neurological: Negative.   Psychiatric/Behavioral: Negative.    Physical Exam   Blood pressure 128/99, pulse 122, temperature 98.4 F (36.9 C), resp. rate 16, height 5\' 7"  (1.702 m), weight 255 lb 6.4 oz (115.849 kg), last menstrual period 06/17/2011, SpO2 97.00%.  Physical Exam  Nursing note and vitals reviewed. Constitutional: She is oriented to person, place, and time. She appears well-developed and well-nourished.  Distressed: appears fatigued.  Cardiovascular: Normal rate, regular rhythm and normal heart sounds.   Respiratory: Effort normal and breath sounds normal. No respiratory distress.  GI: Soft. Bowel sounds are normal. There is no tenderness.  Musculoskeletal: Normal range of motion.  Neurological: She is alert and oriented to person, place, and time.  Skin: Skin is warm and dry.  Psychiatric: She has a normal mood and affect.    MAU Course  Procedures Results for orders placed during the hospital encounter of 08/30/11 (from the past 24 hour(s))  URINALYSIS, ROUTINE W REFLEX MICROSCOPIC     Status: Abnormal   Collection Time   08/30/11 11:50 PM      Component Value Range   Color, Urine YELLOW  YELLOW    APPearance CLEAR  CLEAR    Specific Gravity, Urine >1.030 (*) 1.005 - 1.030    pH 6.0  5.0 - 8.0    Glucose, UA NEGATIVE  NEGATIVE (mg/dL)   Hgb urine dipstick TRACE (*) NEGATIVE    Bilirubin Urine MODERATE (*) NEGATIVE    Ketones, ur >80 (*) NEGATIVE (mg/dL)   Protein, ur 409 (*) NEGATIVE (mg/dL)   Urobilinogen, UA 2.0 (*) 0.0 - 1.0 (mg/dL)   Nitrite NEGATIVE  NEGATIVE    Leukocytes, UA TRACE (*) NEGATIVE   URINE MICROSCOPIC-ADD ON     Status: Abnormal   Collection Time   08/30/11 11:50 PM  Component Value Range   Squamous Epithelial / LPF FEW (*) RARE    WBC, UA 7-10  <3 (WBC/hpf)   RBC / HPF 11-20  <3 (RBC/hpf)   Bacteria, UA FEW (*) RARE   COMPREHENSIVE METABOLIC PANEL     Status: Abnormal   Collection Time   08/31/11 12:30 AM      Component Value Range   Sodium 134 (*) 135 - 145 (mEq/L)   Potassium 2.8 (*) 3.5 - 5.1 (mEq/L)   Chloride 94 (*) 96 - 112 (mEq/L)   CO2 20  19 - 32 (mEq/L)   Glucose, Bld 99  70 - 99 (mg/dL)   BUN 10  6 - 23 (mg/dL)   Creatinine, Ser 4.09  0.50 - 1.10 (mg/dL)   Calcium 9.7  8.4 - 81.1 (mg/dL)   Total Protein 8.5 (*) 6.0 - 8.3 (g/dL)   Albumin 3.6  3.5 - 5.2 (g/dL)   AST 51 (*) 0 - 37 (U/L)   ALT 88 (*) 0 - 35 (U/L)   Alkaline  Phosphatase 125 (*) 39 - 117 (U/L)   Total Bilirubin 0.7  0.3 - 1.2 (mg/dL)   GFR calc non Af Amer >90  >90 (mL/min)   GFR calc Af Amer >90  >90 (mL/min)  CBC     Status: Abnormal   Collection Time   08/31/11 12:30 AM      Component Value Range   WBC 6.3  4.0 - 10.5 (K/uL)   RBC 5.14 (*) 3.87 - 5.11 (MIL/uL)   Hemoglobin 12.4  12.0 - 15.0 (g/dL)   HCT 91.4  78.2 - 95.6 (%)   MCV 72.6 (*) 78.0 - 100.0 (fL)   MCH 24.1 (*) 26.0 - 34.0 (pg)   MCHC 33.2  30.0 - 36.0 (g/dL)   RDW 21.3  08.6 - 57.8 (%)   Platelets 309  150 - 400 (K/uL)   IV hydration initiated, pt no longer c/o nausea after IV Zofran and Phenergan, D5LR with MVI and KCL 20 mEq given for hypokalemia.   Pt tried ice chips after resolution of nausea and IV hydration, immediately vomited. Dr. Henderson Cloud consulted, pt to be admitted.   Assessment and Plan  25 y.o. G2P0010 at [redacted]w[redacted]d Hyperemesis Hypokalemia Admit to women's unit  Miranda Huynh,Miranda Huynh 08/31/2011, 12:55 AM

## 2011-08-31 NOTE — H&P (Signed)
25 y.o. [redacted]w[redacted]d  G2P0010 comes in c/o persistant N/V.  This is pt's 4 visit for hyperemesis gravidarum.  She reports vomitting 15 times yesterday despite scopalamine; she stopped taking the zofran because it wasn't working for her. Phenergan has failed as well.  Pt had a medrol taper on 11-20 but resumed vomitting soon after.  Stopped Zofran a few days afterward and had worsening N/V ever since.  She was in [place] with her mom and did not come to the hospital until yesterday.  She has lost 20 pounds since her first visit on 11-11.  Past Medical History  Diagnosis Date  . No pertinent past medical history   . Obesity     Past Surgical History  Procedure Date  . Tonsillectomy     2 years ago  . Other surgical history 2012    R Tubal blockage  . Salpingoophorectomy tube unblocked    OB History    Grav Para Term Preterm Abortions TAB SAB Ect Mult Living   2 0 0 0 1 0 1 0 0 0      # Outc Date GA Lbr Len/2nd Wgt Sex Del Anes PTL Lv   1 SAB            2 CUR               History   Social History  . Marital Status: Married    Spouse Name: N/A    Number of Children: N/A  . Years of Education: N/A   Occupational History  . Not on file.   Social History Main Topics  . Smoking status: Never Smoker   . Smokeless tobacco: Never Used  . Alcohol Use: No  . Drug Use: No  . Sexually Active: Yes   Other Topics Concern  . Not on file   Social History Narrative  . No narrative on file   Review of patient's allergies indicates no known allergies.   Prenatal Course:  Infertility; pt first seen for pregnancy by Dr. April Manson (who we referred pt to) and reports u/d at about 7-8 weeks, consistent with LMP, viable and singleton.  Has not yet made it to our office for prenatal care.   Filed Vitals:   08/31/11 0400  BP: 117/87  Pulse: 101  Temp: 98.1 F (36.7 C)  Resp: 18     Lungs/Cor:  NAD Abdomen:  soft, gravid Ex:  no cords, erythema SVE:  deferred  Results for orders placed  during the hospital encounter of 08/30/11 (from the past 24 hour(s))  URINALYSIS, ROUTINE W REFLEX MICROSCOPIC     Status: Abnormal   Collection Time   08/30/11 11:50 PM      Component Value Range   Color, Urine YELLOW  YELLOW    APPearance CLEAR  CLEAR    Specific Gravity, Urine >1.030 (*) 1.005 - 1.030    pH 6.0  5.0 - 8.0    Glucose, UA NEGATIVE  NEGATIVE (mg/dL)   Hgb urine dipstick TRACE (*) NEGATIVE    Bilirubin Urine MODERATE (*) NEGATIVE    Ketones, ur >80 (*) NEGATIVE (mg/dL)   Protein, ur 914 (*) NEGATIVE (mg/dL)   Urobilinogen, UA 2.0 (*) 0.0 - 1.0 (mg/dL)   Nitrite NEGATIVE  NEGATIVE    Leukocytes, UA TRACE (*) NEGATIVE   URINE MICROSCOPIC-ADD ON     Status: Abnormal   Collection Time   08/30/11 11:50 PM      Component Value Range   Squamous Epithelial / LPF  FEW (*) RARE    WBC, UA 7-10  <3 (WBC/hpf)   RBC / HPF 11-20  <3 (RBC/hpf)   Bacteria, UA FEW (*) RARE   COMPREHENSIVE METABOLIC PANEL     Status: Abnormal   Collection Time   08/31/11 12:30 AM      Component Value Range   Sodium 134 (*) 135 - 145 (mEq/L)   Potassium 2.8 (*) 3.5 - 5.1 (mEq/L)   Chloride 94 (*) 96 - 112 (mEq/L)   CO2 20  19 - 32 (mEq/L)   Glucose, Bld 99  70 - 99 (mg/dL)   BUN 10  6 - 23 (mg/dL)   Creatinine, Ser 1.61  0.50 - 1.10 (mg/dL)   Calcium 9.7  8.4 - 09.6 (mg/dL)   Total Protein 8.5 (*) 6.0 - 8.3 (g/dL)   Albumin 3.6  3.5 - 5.2 (g/dL)   AST 51 (*) 0 - 37 (U/L)   ALT 88 (*) 0 - 35 (U/L)   Alkaline Phosphatase 125 (*) 39 - 117 (U/L)   Total Bilirubin 0.7  0.3 - 1.2 (mg/dL)   GFR calc non Af Amer >90  >90 (mL/min)   GFR calc Af Amer >90  >90 (mL/min)  CBC     Status: Abnormal   Collection Time   08/31/11 12:30 AM      Component Value Range   WBC 6.3  4.0 - 10.5 (K/uL)   RBC 5.14 (*) 3.87 - 5.11 (MIL/uL)   Hemoglobin 12.4  12.0 - 15.0 (g/dL)   HCT 04.5  40.9 - 81.1 (%)   MCV 72.6 (*) 78.0 - 100.0 (fL)   MCH 24.1 (*) 26.0 - 34.0 (pg)   MCHC 33.2  30.0 - 36.0 (g/dL)   RDW 91.4   78.2 - 95.6 (%)   Platelets 309  150 - 400 (K/uL)   A/P   Hyperemesis gravidarum at [redacted]w[redacted]d, severe and continued despite steroids and scopolamine.  Pt did not respond this time to outpatient IVF, so admitted for aggressive therapy and K+ repletion. 1. Zofran IV, NPO for now. 2. Hypokalemia- replete in IV, recheck CMET this am. 3. ?Consider Zofran pump? 4.  U/S to assess viability. 5.  Check thyroid function tests. 6.  LFTs elevated- recheck this am with amylase and lipase. 7.  Pepcid IV.  Prosper Paff A

## 2011-08-31 NOTE — Progress Notes (Signed)
Pt reports vomiting 15 times today. Pt currently nauseated

## 2011-09-01 LAB — COMPREHENSIVE METABOLIC PANEL
ALT: 62 U/L — ABNORMAL HIGH (ref 0–35)
AST: 32 U/L (ref 0–37)
Albumin: 2.6 g/dL — ABNORMAL LOW (ref 3.5–5.2)
CO2: 24 mEq/L (ref 19–32)
Calcium: 8.8 mg/dL (ref 8.4–10.5)
Creatinine, Ser: 0.65 mg/dL (ref 0.50–1.10)
GFR calc non Af Amer: >90 mL/min (ref >90)
Sodium: 138 mEq/L (ref 135–145)

## 2011-09-01 LAB — URINALYSIS, ROUTINE W REFLEX MICROSCOPIC
Ketones, ur: NEGATIVE mg/dL
Nitrite: NEGATIVE
Protein, ur: 30 mg/dL — AB
Urobilinogen, UA: 4 mg/dL — ABNORMAL HIGH (ref 0.0–1.0)

## 2011-09-01 LAB — URINE MICROSCOPIC-ADD ON

## 2011-09-01 MED ORDER — POTASSIUM CHLORIDE 2 MEQ/ML IV SOLN
INTRAVENOUS | Status: DC
  Administered 2011-09-01 – 2011-09-02 (×2): via INTRAVENOUS
  Filled 2011-09-01 (×3): qty 1000

## 2011-09-01 MED ORDER — POTASSIUM CHLORIDE 2 MEQ/ML IV SOLN
Freq: Every day | INTRAVENOUS | Status: DC
  Administered 2011-09-01: 14:00:00 via INTRAVENOUS
  Filled 2011-09-01 (×2): qty 1000

## 2011-09-01 MED ORDER — POTASSIUM CHLORIDE 2 MEQ/ML IV SOLN: INTRAVENOUS | Status: DC

## 2011-09-01 MED ORDER — ONDANSETRON 8 MG PO TBDP
8.0000 mg | ORAL_TABLET | Freq: Three times a day (TID) | ORAL | Status: DC | PRN
  Administered 2011-09-01: 8 mg via ORAL
  Filled 2011-09-01: qty 1

## 2011-09-01 NOTE — Progress Notes (Signed)
Patient ID: Dalal Livengood, female   DOB: [birth year], 26 y.o.   MRN: [id number]   Antenatal Admission  S: No emesis since admission yesterday. Pt has been NPO. Has only received IV pepcid and scopolamine patch for N/V.  Has not required additional antiemetics. O: Filed Vitals:   08/31/11 2114 09/01/11 0521 09/01/11 0720 09/01/11 1200  BP: 110/79 111/77  121/79  Pulse: 91 82  89  Temp: 98.5 F (36.9 C) 97.4 F (36.3 C)  97.9 F (36.6 C)  TempSrc: Oral Oral  Oral  Resp: 18 18  16   Height:      Weight:   117.992 kg (260 lb 2 oz)   SpO2: 98% 100%  100%   AOx3, NAD Soft, NT/ND CMP     Component Value Date/Time   NA 138 09/01/2011 1201   K 3.1* 09/01/2011 1201   CL 106 09/01/2011 1201   CO2 24 09/01/2011 1201   GLUCOSE 109* 09/01/2011 1201   BUN 7 09/01/2011 1201   CREATININE 0.65 09/01/2011 1201   CALCIUM 8.8 09/01/2011 1201   PROT 6.3 09/01/2011 1201   ALBUMIN 2.6* 09/01/2011 1201   AST 32 09/01/2011 1201   ALT 62* 09/01/2011 1201   ALKPHOS 93 09/01/2011 1201   BILITOT 0.6 09/01/2011 1201   GFRNONAA >90 09/01/2011 1201   GFRAA >90 09/01/2011 1201   A/P: 25 yo G2P0010 @ 10+6 with HEG 1) Hypokalemia: K was 2.8 now 3.1.  Will increase potassium in IVF 2) TSH low but T4 WNL c/o first trimester influence of high BHCG. No intervention at this time necessary. 3) No emesis since admission.  Will advance diet to dry crackers and cereal 4) Will initiate request for [facility] for obtain zofran pump.

## 2011-09-02 LAB — COMPREHENSIVE METABOLIC PANEL
Albumin: 2.5 g/dL — ABNORMAL LOW (ref 3.5–5.2)
Alkaline Phosphatase: 99 U/L (ref 39–117)
BUN: 7 mg/dL (ref 6–23)
CO2: 25 mEq/L (ref 19–32)
Chloride: 107 mEq/L (ref 96–112)
Creatinine, Ser: 0.67 mg/dL (ref 0.50–1.10)
GFR calc Af Amer: >90 mL/min (ref >90)
GFR calc non Af Amer: >90 mL/min (ref >90)
Glucose, Bld: 100 mg/dL — ABNORMAL HIGH (ref 70–99)
Potassium: 3.4 mEq/L — ABNORMAL LOW (ref 3.5–5.1)
Total Bilirubin: 0.3 mg/dL (ref 0.3–1.2)

## 2011-09-02 MED ORDER — ONDANSETRON 8 MG PO TBDP: 8.0000 mg | ORAL_TABLET | Freq: Three times a day (TID) | ORAL | Status: AC | PRN

## 2011-09-02 MED ORDER — CEFAZOLIN SODIUM 1-5 GM-% IV SOLN
1.0000 g | Freq: Once | INTRAVENOUS | Status: AC
  Administered 2011-09-02: 1 g via INTRAVENOUS
  Filled 2011-09-02: qty 50

## 2011-09-02 MED ORDER — NITROFURANTOIN MONOHYD MACRO 100 MG PO CAPS: 100.0000 mg | ORAL_CAPSULE | Freq: Two times a day (BID) | ORAL | Status: DC

## 2011-09-02 NOTE — Progress Notes (Signed)
D/C instructions & prescriptions given  - pt states that she understands & has no questions. Awaiting ride.

## 2011-09-02 NOTE — Progress Notes (Signed)
Patient ID: Miranda Huynh, female   DOB: [birth year], 25 y.o.   MRN: [id number]  Antenatal PN  S: Doing well, has only required one dose of zofran since admission.   O: Filed Vitals:   09/01/11 1800 09/01/11 2141 09/02/11 0550 09/02/11 1200  BP: 127/89 116/79 120/80 107/73  Pulse: 86 95 97 86  Temp: 97.8 F (36.6 C) 98.1 F (36.7 C) 98.2 F (36.8 C) 98.3 F (36.8 C)  TempSrc: Oral Oral Oral Oral  Resp: 18 18 18 8   Height:      Weight:   121.224 kg (267 lb 4 oz)   SpO2: 100% 100% 100% 100%   CMP     Component Value Date/Time   NA 138 09/02/2011 0519   K 3.4* 09/02/2011 0519   CL 107 09/02/2011 0519   CO2 25 09/02/2011 0519   GLUCOSE 100* 09/02/2011 0519   BUN 7 09/02/2011 0519   CREATININE 0.67 09/02/2011 0519   CALCIUM 8.9 09/02/2011 0519   PROT 6.3 09/02/2011 0519   ALBUMIN 2.5* 09/02/2011 0519   AST 32 09/02/2011 0519   ALT 62* 09/02/2011 0519   ALKPHOS 99 09/02/2011 0519   BILITOT 0.3 09/02/2011 0519   GFRNONAA >90 09/02/2011 0519   GFRAA >90 09/02/2011 0519   A/P 1) HEG improved. Originally had planned a zofran pump but this may not be necessary given pt has only required one dose of zofran since admission. Will D/C with zofran ODTs. 2) Pt has not yet been seen in the office for this pregnancy.  Will have follow up this week for routine care.

## 2011-09-02 NOTE — Discharge Summary (Addendum)
Obstetric Discharge Summary Reason for Admission: Hyperemesis Gravidarum Prenatal Procedures: ultrasound and IV fluid hydration Intrapartum Procedures: NA Postpartum Procedures: NA Complications-Operative and Postpartum: NA Hemoglobin  Date Value Range Status  08/31/2011 12.4  12.0-15.0 (g/dL) Final     HCT  Date Value Range Status  08/31/2011 37.3  36.0-46.0 (%) Final    Discharge Diagnoses: Hyperemesis graviderum, Urinary tract Infection  Discharge Information: Date: 09/02/2011 Activity: unrestricted Diet: routine and [place] Medications: zofran ODT, macrobid Condition: stable Instructions: refer to practice specific booklet Discharge to: home Follow-up Information    Follow up with HORVATH,MICHELLE A. (Follow up in office this week to establish prenatal care)    Contact information:   [address] [place] [address] [phone]          Almon Hercules. 09/02/2011, 12:15 PM

## 2011-09-04 ENCOUNTER — Other Ambulatory Visit (HOSPITAL_COMMUNITY): Payer: Self-pay | Admitting: Obstetrics and Gynecology

## 2011-09-04 ENCOUNTER — Other Ambulatory Visit: Payer: Self-pay | Admitting: Obstetrics and Gynecology

## 2011-09-04 DIAGNOSIS — Z3682 Encounter for antenatal screening for nuchal translucency: Secondary | ICD-10-CM

## 2011-09-04 DIAGNOSIS — Z0489 Encounter for examination and observation for other specified reasons: Secondary | ICD-10-CM

## 2011-09-04 LAB — OB RESULTS CONSOLE RUBELLA ANTIBODY, IGM: Rubella: IMMUNE

## 2011-09-04 LAB — OB RESULTS CONSOLE ABO/RH

## 2011-09-04 LAB — OB RESULTS CONSOLE GC/CHLAMYDIA
Chlamydia: NEGATIVE
Gonorrhea: NEGATIVE

## 2011-09-04 LAB — OB RESULTS CONSOLE ANTIBODY SCREEN: Antibody Screen: NEGATIVE

## 2011-09-08 NOTE — ED Provider Notes (Signed)
ok 

## 2011-09-14 ENCOUNTER — Ambulatory Visit (HOSPITAL_COMMUNITY): Payer: 59 | Attending: Obstetrics and Gynecology

## 2011-09-14 ENCOUNTER — Ambulatory Visit (HOSPITAL_COMMUNITY): Payer: 59

## 2011-10-19 ENCOUNTER — Ambulatory Visit (HOSPITAL_COMMUNITY)
Admission: RE | Admit: 2011-10-19 | Discharge: 2011-10-19 | Disposition: A | Discharge status: Home / Self Care | Payer: 59 | Source: Ambulatory Visit | Attending: Obstetrics and Gynecology | Admitting: Obstetrics and Gynecology

## 2011-10-19 DIAGNOSIS — O210 Mild hyperemesis gravidarum: Secondary | ICD-10-CM | POA: Insufficient documentation

## 2011-10-19 DIAGNOSIS — Z363 Encounter for antenatal screening for malformations: Secondary | ICD-10-CM | POA: Insufficient documentation

## 2011-10-19 DIAGNOSIS — Z0489 Encounter for examination and observation for other specified reasons: Secondary | ICD-10-CM

## 2011-10-19 DIAGNOSIS — O358XX0 Maternal care for other (suspected) fetal abnormality and damage, not applicable or unspecified: Secondary | ICD-10-CM | POA: Insufficient documentation

## 2011-10-19 DIAGNOSIS — Z1389 Encounter for screening for other disorder: Secondary | ICD-10-CM | POA: Insufficient documentation

## 2011-10-19 NOTE — Progress Notes (Signed)
Ms. Owensby was seen for ultrasound appointment today.  Please see [facility] report for details.

## 2011-11-30 ENCOUNTER — Ambulatory Visit (HOSPITAL_COMMUNITY)
Admission: RE | Admit: 2011-11-30 | Discharge: 2011-11-30 | Disposition: A | Discharge status: Home / Self Care | Payer: 59 | Source: Ambulatory Visit | Attending: Obstetrics and Gynecology | Admitting: Obstetrics and Gynecology

## 2011-11-30 DIAGNOSIS — O210 Mild hyperemesis gravidarum: Secondary | ICD-10-CM | POA: Insufficient documentation

## 2011-11-30 DIAGNOSIS — Z0489 Encounter for examination and observation for other specified reasons: Secondary | ICD-10-CM

## 2011-11-30 DIAGNOSIS — Z3689 Encounter for other specified antenatal screening: Secondary | ICD-10-CM | POA: Insufficient documentation

## 2012-01-30 ENCOUNTER — Inpatient Hospital Stay (HOSPITAL_COMMUNITY)
Admission: AD | Admit: 2012-01-30 | Discharge: 2012-01-30 | Disposition: A | Discharge status: Home / Self Care | Payer: 59 | Source: Ambulatory Visit | Attending: Obstetrics and Gynecology | Admitting: Obstetrics and Gynecology

## 2012-01-30 ENCOUNTER — Encounter (HOSPITAL_COMMUNITY): Payer: Self-pay | Admitting: *Deleted

## 2012-01-30 DIAGNOSIS — R1084 Generalized abdominal pain: Secondary | ICD-10-CM

## 2012-01-30 DIAGNOSIS — R142 Eructation: Secondary | ICD-10-CM | POA: Insufficient documentation

## 2012-01-30 DIAGNOSIS — R109 Unspecified abdominal pain: Secondary | ICD-10-CM | POA: Insufficient documentation

## 2012-01-30 DIAGNOSIS — Z331 Pregnant state, incidental: Secondary | ICD-10-CM

## 2012-01-30 DIAGNOSIS — O99891 Other specified diseases and conditions complicating pregnancy: Secondary | ICD-10-CM | POA: Insufficient documentation

## 2012-01-30 DIAGNOSIS — R141 Gas pain: Secondary | ICD-10-CM | POA: Insufficient documentation

## 2012-01-30 NOTE — MAU Provider Note (Signed)
Chief Complaint:  Abdominal Pain    First Provider Initiated Contact with Patient 01/30/12 1538      Miranda Huynh is  26 y.o. G3P0010.  Patient's last menstrual period was 06/17/2011..   She presents complaining of Abdominal Pain . Onset is described as sudden, started at 54. Describes sharp peri-umbilical pain after eating. Is now subsided. Reports good fetal movement. Denies blding, dysuria, back pain, contractions, LOF   Obstetrical/Gynecological History: OB History    Grav Para Term Preterm Abortions TAB SAB Ect Mult Living   3 0 0 0 1 0 1 0 0 0       Past Medical History: Past Medical History  Diagnosis Date  . No pertinent past medical history   . Obesity     Past Surgical History: Past Surgical History  Procedure Date  . Tonsillectomy     2 years ago  . Other surgical history 2012    R Tubal blockage  . Salpingoophorectomy tube unblocked    Family History: Family History  Problem Relation Age of Onset  . Cancer Maternal Grandmother   . Anesthesia problems Neg Hx     Social History: History  Substance Use Topics  . Smoking status: Never Smoker   . Smokeless tobacco: Never Used  . Alcohol Use: No    Allergies: No Known Allergies  Prescriptions prior to admission  Medication Sig Dispense Refill  . Ferrous Sulfate (PX IRON) 27 MG TABS Take 1 tablet by mouth every morning.      . Prenatal Vit-Fe Fumarate-FA (PRENATAL MULTIVITAMIN) TABS Take 1 tablet by mouth every morning.        Review of Systems - Negative except what has been reviewed in HPI  Physical Exam   Blood pressure 129/69, pulse 84, temperature 98.4 F (36.9 C), temperature source Oral, resp. rate 20, height 5\' 7"  (1.702 m), weight 296 lb 6.4 oz (134.446 kg), last menstrual period 06/17/2011, SpO2 100.00%, unknown if currently breastfeeding.  General: General appearance - alert, well appearing, and in no distress, oriented to person, place, and time and morbidly obese Mental status -  alert, oriented to person, place, and time, normal mood, behavior, speech, dress, motor activity, and thought processes, affect appropriate to mood Abdomen -Obese, Gravid, non tender Focused Gynecological Exam: CERVIX: closed/long/thick/vtx FHR: 125, mod variability, +15x15 accels, no decels Cat I tracing Toco: no contractions   Assessment: 1. Gas pain   2. Pregnant state, incidental     Plan: Discharge home Comfort measures reviewed   Thomson Herbers E. 01/30/2012,4:04 PM

## 2012-01-30 NOTE — Discharge Instructions (Signed)
Bloating  Bloating is the feeling of fullness in your belly. You may feel as though your pants are too tight. Often the cause of bloating is overeating, retaining fluids, or having gas in your bowel. It is also caused by swallowing air and eating foods that cause gas. Irritable bowel syndrome is one of the most common causes of bloating. Constipation is also a common cause. Sometimes more serious problems can cause bloating.  SYMPTOMS    Usually there is a feeling of fullness, as though your abdomen is bulged out. There may be mild discomfort.    DIAGNOSIS    Usually no particular testing is necessary for most bloating. If the condition persists and seems to become worse, your caregiver may do additional testing.    TREATMENT     There is no direct treatment for bloating.    Do not put gas into the bowel. Avoid chewing gum and sucking on candy. These tend to make you swallow air. Swallowing air can also be a nervous habit. Try to avoid this.    Avoiding high residue diets will help. Eat foods with soluble fibers (examples include root vegetables, apples, or barley) and substitute dairy products with soy and rice products. This helps irritable bowel syndrome.    If constipation is the cause, then a high residue diet with more fiber will help.    Avoid carbonated beverages.    Over-the-counter preparations are available that help reduce gas. Your pharmacist can help you with this.   SEEK MEDICAL CARE IF:     Bloating continues and seems to be getting worse.    You notice a weight gain.    You have a weight loss but the bloating is getting worse.    You have changes in your bowel habits or develop nausea or vomiting.   SEEK IMMEDIATE MEDICAL CARE IF:     You develop shortness of breath or swelling in your legs.    You have an increase in abdominal pain or develop chest pain.   Document Released: 07/11/2006 Document Revised: 08/30/2011 Document Reviewed: 08/29/2007   [employer] Patient Information 2012 [employer].

## 2012-01-30 NOTE — MAU Note (Signed)
Pt states abd pain started today about 1315.  Pains are sharp in nature, upper center of abd just below sternum.  Pt denies any ROM or vaginal bleeding.

## 2012-03-17 ENCOUNTER — Encounter (HOSPITAL_COMMUNITY): Payer: Self-pay | Admitting: Obstetrics and Gynecology

## 2012-03-17 ENCOUNTER — Inpatient Hospital Stay (HOSPITAL_COMMUNITY)
Admission: AD | Admit: 2012-03-17 | Discharge: 2012-03-17 | Disposition: A | Discharge status: Home / Self Care | Payer: Medicaid Other | Source: Ambulatory Visit | Attending: Obstetrics and Gynecology | Admitting: Obstetrics and Gynecology

## 2012-03-17 DIAGNOSIS — O479 False labor, unspecified: Secondary | ICD-10-CM | POA: Insufficient documentation

## 2012-03-17 NOTE — MAU Note (Signed)
"  I started contracting around 0730 this morning every 10 mins.  Now they are coming about every 7 mins.  Next appt is Friday.  No bleeding or leaking of fluid. (+) FM."

## 2012-03-17 NOTE — MAU Note (Signed)
Pt states ctx's were q98minutes, now spacing out. Denies bleeding or lof. Was not dilated at last exam.

## 2012-03-17 NOTE — Discharge Instructions (Signed)
Dr. Claiborne Billings has left the following orders for you: Please collect a 24 hour urine starting today.  You will discard your next urine and begin collecting every urine after that for 24 hours.  You will need to return the urine to [facility] office and you will need to have pregnancy induced hypertension labs drawn.

## 2012-03-19 ENCOUNTER — Encounter (HOSPITAL_COMMUNITY): Payer: Self-pay | Admitting: *Deleted

## 2012-03-19 ENCOUNTER — Encounter (HOSPITAL_COMMUNITY): Payer: Self-pay | Admitting: Anesthesiology

## 2012-03-19 ENCOUNTER — Inpatient Hospital Stay (HOSPITAL_COMMUNITY)
Admission: AD | Admit: 2012-03-19 | Discharge: 2012-03-22 | DRG: 765 | Disposition: A | Discharge status: Home / Self Care | Payer: Medicaid Other | Source: Ambulatory Visit | Attending: Obstetrics and Gynecology | Admitting: Obstetrics and Gynecology

## 2012-03-19 ENCOUNTER — Inpatient Hospital Stay (HOSPITAL_COMMUNITY): Payer: Medicaid Other | Admitting: Anesthesiology

## 2012-03-19 ENCOUNTER — Encounter (HOSPITAL_COMMUNITY)
Admission: AD | Disposition: A | Discharge status: Home / Self Care | Payer: Self-pay | Source: Ambulatory Visit | Attending: Obstetrics and Gynecology

## 2012-03-19 DIAGNOSIS — O139 Gestational [pregnancy-induced] hypertension without significant proteinuria, unspecified trimester: Secondary | ICD-10-CM | POA: Diagnosis present

## 2012-03-19 DIAGNOSIS — O210 Mild hyperemesis gravidarum: Secondary | ICD-10-CM

## 2012-03-19 DIAGNOSIS — E669 Obesity, unspecified: Secondary | ICD-10-CM | POA: Diagnosis present

## 2012-03-19 LAB — COMPREHENSIVE METABOLIC PANEL
AST: 20 U/L (ref 0–37)
Albumin: 2.7 g/dL — ABNORMAL LOW (ref 3.5–5.2)
BUN: 7 mg/dL (ref 6–23)
Calcium: 9.4 mg/dL (ref 8.4–10.5)
Creatinine, Ser: 0.62 mg/dL (ref 0.50–1.10)
Total Protein: 6.5 g/dL (ref 6.0–8.3)

## 2012-03-19 LAB — URINALYSIS, ROUTINE W REFLEX MICROSCOPIC
Bilirubin Urine: NEGATIVE
Ketones, ur: 40 mg/dL — AB
Nitrite: NEGATIVE
Specific Gravity, Urine: 1.02 (ref 1.005–1.030)
Urobilinogen, UA: 0.2 mg/dL (ref 0.0–1.0)

## 2012-03-19 LAB — ABO/RH: ABO/RH(D): O POS

## 2012-03-19 LAB — CBC
HCT: 31.3 % — ABNORMAL LOW (ref 36.0–46.0)
Hemoglobin: 9.9 g/dL — ABNORMAL LOW (ref 12.0–15.0)
MCH: 23.4 pg — ABNORMAL LOW (ref 26.0–34.0)
MCHC: 31.6 g/dL (ref 30.0–36.0)
RBC: 4.23 MIL/uL (ref 3.87–5.11)

## 2012-03-19 LAB — RPR: RPR Ser Ql: NONREACTIVE

## 2012-03-19 LAB — URIC ACID: Uric Acid, Serum: 6.3 mg/dL (ref 2.4–7.0)

## 2012-03-19 LAB — LACTATE DEHYDROGENASE: LDH: 235 U/L (ref 94–250)

## 2012-03-19 LAB — URINE MICROSCOPIC-ADD ON

## 2012-03-19 SURGERY — Surgical Case
Anesthesia: Regional | Site: Abdomen | Wound class: Clean Contaminated

## 2012-03-19 MED ORDER — PHENYLEPHRINE 40 MCG/ML (10ML) SYRINGE FOR IV PUSH (FOR BLOOD PRESSURE SUPPORT): 80.0000 ug | PREFILLED_SYRINGE | INTRAVENOUS | Status: DC | PRN

## 2012-03-19 MED ORDER — CITRIC ACID-SODIUM CITRATE 334-500 MG/5ML PO SOLN
30.0000 mL | ORAL | Status: DC | PRN
  Administered 2012-03-19: 30 mL via ORAL
  Filled 2012-03-19: qty 15

## 2012-03-19 MED ORDER — EPHEDRINE 5 MG/ML INJ
10.0000 mg | INTRAVENOUS | Status: DC | PRN
  Filled 2012-03-19: qty 4

## 2012-03-19 MED ORDER — LIDOCAINE-EPINEPHRINE (PF) 2 %-1:200000 IJ SOLN
INTRAMUSCULAR | Status: AC
  Filled 2012-03-19: qty 20

## 2012-03-19 MED ORDER — IBUPROFEN 600 MG PO TABS: 600.0000 mg | ORAL_TABLET | Freq: Four times a day (QID) | ORAL | Status: DC | PRN

## 2012-03-19 MED ORDER — LACTATED RINGERS IV SOLN
INTRAVENOUS | Status: DC | PRN
  Administered 2012-03-19 (×2): via INTRAVENOUS

## 2012-03-19 MED ORDER — ZOLPIDEM TARTRATE 10 MG PO TABS: 10.0000 mg | ORAL_TABLET | Freq: Every evening | ORAL | Status: DC | PRN

## 2012-03-19 MED ORDER — BUTORPHANOL TARTRATE 2 MG/ML IJ SOLN
1.0000 mg | INTRAMUSCULAR | Status: DC | PRN
  Administered 2012-03-19: 1 mg via INTRAVENOUS
  Filled 2012-03-19: qty 1

## 2012-03-19 MED ORDER — ACETAMINOPHEN 325 MG PO TABS: 650.0000 mg | ORAL_TABLET | ORAL | Status: DC | PRN

## 2012-03-19 MED ORDER — DIPHENHYDRAMINE HCL 50 MG/ML IJ SOLN: 12.5000 mg | INTRAMUSCULAR | Status: DC | PRN

## 2012-03-19 MED ORDER — CEFAZOLIN SODIUM 1-5 GM-% IV SOLN
INTRAVENOUS | Status: DC | PRN
  Administered 2012-03-19: 2 g via INTRAVENOUS

## 2012-03-19 MED ORDER — LIDOCAINE HCL (PF) 1 % IJ SOLN: 30.0000 mL | INTRAMUSCULAR | Status: DC | PRN

## 2012-03-19 MED ORDER — OXYTOCIN 40 UNITS IN LACTATED RINGERS INFUSION - SIMPLE MED
INTRAVENOUS | Status: DC | PRN
  Administered 2012-03-19: 40 [IU] via INTRAVENOUS

## 2012-03-19 MED ORDER — FENTANYL 2.5 MCG/ML BUPIVACAINE 1/10 % EPIDURAL INFUSION (WH - ANES)
INTRAMUSCULAR | Status: DC | PRN
  Administered 2012-03-19: 14 mL/h via EPIDURAL

## 2012-03-19 MED ORDER — MORPHINE SULFATE (PF) 0.5 MG/ML IJ SOLN
INTRAMUSCULAR | Status: DC | PRN
  Administered 2012-03-19: 2 mg via INTRAVENOUS

## 2012-03-19 MED ORDER — TERBUTALINE SULFATE 1 MG/ML IJ SOLN: 0.2500 mg | Freq: Once | INTRAMUSCULAR | Status: DC | PRN

## 2012-03-19 MED ORDER — FLEET ENEMA 7-19 GM/118ML RE ENEM: 1.0000 | ENEMA | RECTAL | Status: DC | PRN

## 2012-03-19 MED ORDER — OXYTOCIN 40 UNITS IN LACTATED RINGERS INFUSION - SIMPLE MED: 62.5000 mL/h | Freq: Once | INTRAVENOUS | Status: DC

## 2012-03-19 MED ORDER — OXYTOCIN BOLUS FROM INFUSION
250.0000 mL | Freq: Once | INTRAVENOUS | Status: DC
  Filled 2012-03-19: qty 500

## 2012-03-19 MED ORDER — LACTATED RINGERS IV SOLN
500.0000 mL | INTRAVENOUS | Status: DC | PRN
  Administered 2012-03-19: 500 mL via INTRAVENOUS

## 2012-03-19 MED ORDER — SODIUM BICARBONATE 8.4 % IV SOLN
INTRAVENOUS | Status: DC | PRN
  Administered 2012-03-19: 4 mL via EPIDURAL

## 2012-03-19 MED ORDER — CEFAZOLIN SODIUM 1-5 GM-% IV SOLN
INTRAVENOUS | Status: AC
  Filled 2012-03-19: qty 100

## 2012-03-19 MED ORDER — OXYCODONE-ACETAMINOPHEN 5-325 MG PO TABS: 1.0000 | ORAL_TABLET | ORAL | Status: DC | PRN

## 2012-03-19 MED ORDER — LACTATED RINGERS IV SOLN: 500.0000 mL | Freq: Once | INTRAVENOUS | Status: DC

## 2012-03-19 MED ORDER — LACTATED RINGERS IV SOLN
INTRAVENOUS | Status: DC
  Administered 2012-03-19 (×2): via INTRAVENOUS

## 2012-03-19 MED ORDER — PHENYLEPHRINE 40 MCG/ML (10ML) SYRINGE FOR IV PUSH (FOR BLOOD PRESSURE SUPPORT)
80.0000 ug | PREFILLED_SYRINGE | INTRAVENOUS | Status: DC | PRN
  Filled 2012-03-19: qty 5

## 2012-03-19 MED ORDER — LABETALOL HCL 5 MG/ML IV SOLN
10.0000 mg | INTRAVENOUS | Status: DC | PRN
  Filled 2012-03-19: qty 4

## 2012-03-19 MED ORDER — SODIUM BICARBONATE 8.4 % IV SOLN
INTRAVENOUS | Status: AC
  Filled 2012-03-19: qty 50

## 2012-03-19 MED ORDER — MORPHINE SULFATE (PF) 0.5 MG/ML IJ SOLN
INTRAMUSCULAR | Status: DC | PRN
  Administered 2012-03-19: 3 mg via EPIDURAL

## 2012-03-19 MED ORDER — ONDANSETRON HCL 4 MG/2ML IJ SOLN
4.0000 mg | Freq: Four times a day (QID) | INTRAMUSCULAR | Status: DC | PRN
  Filled 2012-03-19: qty 2

## 2012-03-19 MED ORDER — FENTANYL 2.5 MCG/ML BUPIVACAINE 1/10 % EPIDURAL INFUSION (WH - ANES)
14.0000 mL/h | INTRAMUSCULAR | Status: DC
  Filled 2012-03-19: qty 60

## 2012-03-19 MED ORDER — MISOPROSTOL 25 MCG QUARTER TABLET
25.0000 ug | ORAL_TABLET | ORAL | Status: DC | PRN
  Administered 2012-03-19: 25 ug via VAGINAL
  Filled 2012-03-19 (×2): qty 0.25

## 2012-03-19 MED ORDER — MORPHINE SULFATE 0.5 MG/ML IJ SOLN
INTRAMUSCULAR | Status: AC
  Filled 2012-03-19: qty 10

## 2012-03-19 MED ORDER — OXYTOCIN 40 UNITS IN LACTATED RINGERS INFUSION - SIMPLE MED
1.0000 m[IU]/min | INTRAVENOUS | Status: DC
  Administered 2012-03-19: 2 m[IU]/min via INTRAVENOUS
  Filled 2012-03-19: qty 1000

## 2012-03-19 MED ORDER — ONDANSETRON HCL 4 MG/2ML IJ SOLN
INTRAMUSCULAR | Status: AC
  Filled 2012-03-19: qty 2

## 2012-03-19 MED ORDER — ONDANSETRON HCL 4 MG/2ML IJ SOLN
INTRAMUSCULAR | Status: DC | PRN
  Administered 2012-03-19: 4 mg via INTRAVENOUS

## 2012-03-19 MED ORDER — OXYTOCIN 10 UNIT/ML IJ SOLN
INTRAMUSCULAR | Status: AC
  Filled 2012-03-19: qty 4

## 2012-03-19 MED ORDER — EPHEDRINE 5 MG/ML INJ
10.0000 mg | INTRAVENOUS | Status: DC | PRN
Start: 2012-03-19 — End: 2012-03-19

## 2012-03-19 SURGICAL SUPPLY — 32 items
ADH SKN CLS APL DERMABOND .7 (GAUZE/BANDAGES/DRESSINGS) ×2
CLOTH BEACON ORANGE TIMEOUT ST (SAFETY) ×2 IMPLANT
DERMABOND ADVANCED (GAUZE/BANDAGES/DRESSINGS) ×2
DERMABOND ADVANCED .7 DNX12 (GAUZE/BANDAGES/DRESSINGS) IMPLANT
DRESSING TELFA 8X3 (GAUZE/BANDAGES/DRESSINGS) ×2 IMPLANT
DRSG COVADERM 4X10 (GAUZE/BANDAGES/DRESSINGS) ×1 IMPLANT
ELECT REM PT RETURN 9FT ADLT (ELECTROSURGICAL) ×2
ELECTRODE REM PT RTRN 9FT ADLT (ELECTROSURGICAL) ×1 IMPLANT
EXTRACTOR VACUUM M CUP 4 TUBE (SUCTIONS) IMPLANT
GAUZE SPONGE 4X4 12PLY STRL LF (GAUZE/BANDAGES/DRESSINGS) ×4 IMPLANT
GLOVE BIO SURGEON STRL SZ7 (GLOVE) ×4 IMPLANT
GOWN PREVENTION PLUS LG XLONG (DISPOSABLE) ×4 IMPLANT
KIT ABG SYR 3ML LUER SLIP (SYRINGE) IMPLANT
NDL HYPO 25X5/8 SAFETYGLIDE (NEEDLE) IMPLANT
NEEDLE HYPO 25X5/8 SAFETYGLIDE (NEEDLE) IMPLANT
NS IRRIG 1000ML POUR BTL (IV SOLUTION) ×2 IMPLANT
PACK C SECTION WH (CUSTOM PROCEDURE TRAY) ×2 IMPLANT
PAD ABD 7.5X8 STRL (GAUZE/BANDAGES/DRESSINGS) ×2 IMPLANT
RTRCTR C-SECT PINK 25CM LRG (MISCELLANEOUS) IMPLANT
RTRCTR C-SECT PINK 34CM XLRG (MISCELLANEOUS) IMPLANT
SLEEVE SCD COMPRESS KNEE MED (MISCELLANEOUS) IMPLANT
SPONGE LAP 18X18 X RAY DECT (DISPOSABLE) ×2 IMPLANT
STAPLER VISISTAT 35W (STAPLE) IMPLANT
SUT CHROMIC 1 CTX 36 (SUTURE) ×4 IMPLANT
SUT PDS AB 0 CTX 60 (SUTURE) ×2 IMPLANT
SUT PLAIN 2 0 XLH (SUTURE) ×1 IMPLANT
SUT VIC AB 2-0 CT1 27 (SUTURE) ×2
SUT VIC AB 2-0 CT1 TAPERPNT 27 (SUTURE) ×1 IMPLANT
SUT VIC AB 4-0 KS 27 (SUTURE) ×1 IMPLANT
TOWEL OR 17X24 6PK STRL BLUE (TOWEL DISPOSABLE) ×4 IMPLANT
TRAY FOLEY CATH 14FR (SET/KITS/TRAYS/PACK) ×2 IMPLANT
WATER STERILE IRR 1000ML POUR (IV SOLUTION) ×2 IMPLANT

## 2012-03-19 NOTE — MAU Provider Note (Signed)
  History     CSN: [id number]  Arrival date and time: 03/19/12 1045   First Provider Initiated Contact with Patient 03/19/12 1111      Chief Complaint  Patient presents with  . Headache   HPI  Rheba Diamond 26 y.o. [redacted]w[redacted]d sent over by office for evaluation.  Dr. Tenny Craw in unit.  MSE complete.    OB History    Grav Para Term Preterm Abortions TAB SAB Ect Mult Living   2 1 1  0 1 0 1 0 0 1      Past Medical History  Diagnosis Date  . Obesity     Past Surgical History  Procedure Date  . Tonsillectomy     2 years ago  . Other surgical history 2012    R Tubal blockage    Family History  Problem Relation Age of Onset  . Cancer Maternal Grandmother   . Anesthesia problems Neg Hx     History  Substance Use Topics  . Smoking status: Never Smoker   . Smokeless tobacco: Never Used  . Alcohol Use: No    Allergies: No Known Allergies  Prescriptions prior to admission  Medication Sig Dispense Refill  . Ferrous Sulfate (PX IRON) 27 MG TABS Take 1 tablet by mouth every morning.      . flintstones complete (FLINTSTONES) 60 MG chewable tablet Chew 1 tablet by mouth every morning.        ROS Physical Exam   Blood pressure 137/95, pulse 87, temperature 98.3 F (36.8 C), temperature source Oral, resp. rate 20, height 5\' 8"  (1.727 m), weight 140.161 kg (309 lb), last menstrual period 06/17/2011.  Physical Exam  MAU Course  Procedures  MDM Dr. Tenny Craw present in unit to assume care of patient.    Assessment and Plan    Servando Salina 03/19/2012, 11:13 AM

## 2012-03-19 NOTE — Anesthesia Procedure Notes (Signed)
Epidural Patient location during procedure: OB  Preanesthetic Checklist Completed: patient identified, site marked, surgical consent, pre-op evaluation, timeout performed, IV checked, risks and benefits discussed and monitors and equipment checked  Epidural Patient position: sitting Prep: site prepped and draped and DuraPrep Patient monitoring: continuous pulse ox and blood pressure Approach: midline Injection technique: LOR air  Needle:  Needle type: Tuohy  Needle gauge: 17 G Needle length: 9 cm Needle insertion depth: 8 cm Catheter type: closed end flexible Catheter size: 19 Gauge Test dose: negative  Assessment Events: blood not aspirated, injection not painful, no injection resistance, negative IV test and no paresthesia  Additional Notes Dosing of Epidural:  1st dose, through needle ............................................. epi 1:200K + Xylocaine 40 mg  2nd dose, through catheter, after waiting 3 minutes.....epi 1:200K + Xylocaine 40 mg  3rd dose, through catheter after waiting 3 minutes .............................Marcaine   4mg   ( mg Marcaine are expressed as equivilent  cc's medication removed from the 0.1%Bupiv / fentanyl syringe from L&D pump)  ( 2% Xylo charted as a single dose in Epic Meds for ease of charting; actual dosing was fractionated as above, for saftey's sake)  As each dose occurred, patient was free of IV sx; and patient exhibited no evidence of SA injection.  Patient is more comfortable after epidural dosed. Please see RN's note for documentation of vital signs,and FHR which are stable.  Patient reminded not to try to ambulate with numb legs, and that an RN must be present the 1st time she attempts to get up.    

## 2012-03-19 NOTE — MAU Note (Signed)
Pt sent in by MD due to headache elevated b/p and 1+ protein in her urine.

## 2012-03-19 NOTE — Anesthesia Preprocedure Evaluation (Signed)
Anesthesia Evaluation  Patient identified by MRN, date of birth, ID band Patient awake    Reviewed: Allergy & Precautions, H&P , Patient's Chart, lab work & pertinent test results  Airway Mallampati: II  TM Distance: >3 FB Neck ROM: full    Dental  (+) Teeth Intact   Pulmonary    breath sounds clear to auscultation       Cardiovascular hypertension,  Rhythm:regular Rate:Normal     Neuro/Psych    GI/Hepatic   Endo/Other  Morbid obesity  Renal/GU      Musculoskeletal   Abdominal   Peds  Hematology   Anesthesia Other Findings       Reproductive/Obstetrics (+) Pregnancy                             Anesthesia Physical Anesthesia Plan  ASA: III  Anesthesia Plan: Epidural   Post-op Pain Management:    Induction:   Airway Management Planned:   Additional Equipment:   Intra-op Plan:   Post-operative Plan:   Informed Consent: I have reviewed the patients History and Physical, chart, labs and discussed the procedure including the risks, benefits and alternatives for the proposed anesthesia with the patient or authorized representative who has indicated his/her understanding and acceptance.   Dental Advisory Given  Plan Discussed with:   Anesthesia Plan Comments: (Labs checked- platelets confirmed with RN in room. Fetal heart tracing, per RN, reported to be stable enough for sitting procedure. Discussed epidural, and patient consents to the procedure:  included risk of possible headache,backache, failed block, allergic reaction, and nerve injury. This patient was asked if she had any questions or concerns before the procedure started.)        Anesthesia Quick Evaluation  

## 2012-03-19 NOTE — H&P (Signed)
Miranda Huynh is a 26 y.o. female presenting for elevated blood pressure The patient was seen earlier this week for elevated BPs in MAU.  She was discharged home with a 24 hour urine to be returned today.  In the office her BPs were elevated and she was having irregular contractions.  She was sent to MAU for further eval.  In MAU BPs were 140-150/80-100.  Given this the decision was made to admit the patient and augment labor for gestational hypertension.  Her pregnancy to this point has been complicated by obesity. History OB History    Grav Para Term Preterm Abortions TAB SAB Ect Mult Living   2 0 0 0 1 0 1 0 0 0      Past Medical History  Diagnosis Date  . Obesity   2) Anemia  Past Surgical History  Procedure Date  . Tonsillectomy     2 years ago  . Other surgical history 2012    R Tubal blockage   Family History: family history includes Cancer in her maternal grandmother.  There is no history of Anesthesia problems. Social History:  reports that she has never smoked. She has never used smokeless tobacco. She reports that she does not drink alcohol or use illicit drugs.   Prenatal Transfer Tool  Maternal Diabetes: No Genetic Screening: Declined Maternal Ultrasounds/Referrals: Normal Fetal Ultrasounds or other Referrals:  None Maternal Substance Abuse:  No Significant Maternal Medications:  None Significant Maternal Lab Results:  Pending Pre-eclampsia labs Other Comments:  None  ROS: As above  Dilation: 1.5 Effacement (%): 70 Station: -2 Exam by:: Dr. Tenny Craw Blood pressure 157/100, pulse 94, temperature 98.3 F (36.8 C), temperature source Oral, resp. rate 20, height 5\' 8"  (1.727 m), weight 140.161 kg (309 lb), last menstrual period 06/17/2011. Exam Physical Exam   AOX3 Obese gravid soft NT FHT 130 reactive no decels cvx 1-2/70/-2  Prenatal labs: ABO, Rh: O/Positive/-- (12/11 0000) Antibody: Negative (12/11 0000) Rubella: Immune (12/11 0000) RPR: Nonreactive  (12/11 0000)  HBsAg: Negative (12/11 0000)  HIV: Non-reactive (12/11 0000)  GBS: Negative (05/24 0000)   Assessment/Plan: 26 yo G2P0010 @ 39 weeks admitted for induction/augmentation of labor for gestational hypertension 1) Admit 2) Pre-eclampsia labs pending 3) Misoprostal per vagina followed by pitocin 4) Labetalol IV prn control of BPs   Anthonymichael Munday H. 03/19/2012, 12:08 PM

## 2012-03-20 ENCOUNTER — Encounter (HOSPITAL_COMMUNITY): Payer: Self-pay | Admitting: *Deleted

## 2012-03-20 LAB — CBC
MCH: 24 pg — ABNORMAL LOW (ref 26.0–34.0)
MCHC: 32.1 g/dL (ref 30.0–36.0)
MCV: 74.7 fL — ABNORMAL LOW (ref 78.0–100.0)
Platelets: 295 10*3/uL (ref 150–400)
Platelets: 332 10*3/uL (ref 150–400)
RBC: 4.04 MIL/uL (ref 3.87–5.11)
RDW: 17.4 % — ABNORMAL HIGH (ref 11.5–15.5)
RDW: 17.4 % — ABNORMAL HIGH (ref 11.5–15.5)
WBC: 8.1 10*3/uL (ref 4.0–10.5)
WBC: 9.4 10*3/uL (ref 4.0–10.5)

## 2012-03-20 MED ORDER — KETOROLAC TROMETHAMINE 60 MG/2ML IM SOLN
60.0000 mg | Freq: Once | INTRAMUSCULAR | Status: AC | PRN
  Administered 2012-03-20: 60 mg via INTRAMUSCULAR

## 2012-03-20 MED ORDER — MEPERIDINE HCL 25 MG/ML IJ SOLN
6.2500 mg | INTRAMUSCULAR | Status: AC | PRN
  Administered 2012-03-20 (×2): 6.25 mg via INTRAVENOUS

## 2012-03-20 MED ORDER — LACTATED RINGERS IV SOLN
INTRAVENOUS | Status: DC
  Administered 2012-03-20 (×3): via INTRAVENOUS

## 2012-03-20 MED ORDER — METOCLOPRAMIDE HCL 5 MG/ML IJ SOLN: 10.0000 mg | Freq: Three times a day (TID) | INTRAMUSCULAR | Status: DC | PRN

## 2012-03-20 MED ORDER — OXYCODONE-ACETAMINOPHEN 5-325 MG PO TABS
1.0000 | ORAL_TABLET | ORAL | Status: DC | PRN
Start: 2012-03-20 — End: 2012-03-22
  Administered 2012-03-21 (×4): 1 via ORAL
  Administered 2012-03-22: 2 via ORAL
  Administered 2012-03-22: 1 via ORAL
  Filled 2012-03-20 (×3): qty 1
  Filled 2012-03-20: qty 2
  Filled 2012-03-20 (×3): qty 1

## 2012-03-20 MED ORDER — PRENATAL MULTIVITAMIN CH
1.0000 | ORAL_TABLET | Freq: Every day | ORAL | Status: DC
  Administered 2012-03-20 – 2012-03-22 (×3): 1 via ORAL
  Filled 2012-03-20 (×3): qty 1

## 2012-03-20 MED ORDER — WITCH HAZEL-GLYCERIN EX PADS: 1.0000 "application " | MEDICATED_PAD | CUTANEOUS | Status: DC | PRN

## 2012-03-20 MED ORDER — ONDANSETRON HCL 4 MG/2ML IJ SOLN: 4.0000 mg | Freq: Three times a day (TID) | INTRAMUSCULAR | Status: DC | PRN

## 2012-03-20 MED ORDER — SCOPOLAMINE 1 MG/3DAYS TD PT72
MEDICATED_PATCH | TRANSDERMAL | Status: AC
  Administered 2012-03-20: 1.5 mg via TRANSDERMAL
  Filled 2012-03-20: qty 1

## 2012-03-20 MED ORDER — NALOXONE HCL 0.4 MG/ML IJ SOLN: 0.4000 mg | INTRAMUSCULAR | Status: DC | PRN

## 2012-03-20 MED ORDER — IBUPROFEN 600 MG PO TABS: 600.0000 mg | ORAL_TABLET | Freq: Four times a day (QID) | ORAL | Status: DC | PRN

## 2012-03-20 MED ORDER — SODIUM CHLORIDE 0.9 % IJ SOLN: 3.0000 mL | INTRAMUSCULAR | Status: DC | PRN

## 2012-03-20 MED ORDER — SIMETHICONE 80 MG PO CHEW: 80.0000 mg | CHEWABLE_TABLET | ORAL | Status: DC | PRN

## 2012-03-20 MED ORDER — KETOROLAC TROMETHAMINE 60 MG/2ML IM SOLN
INTRAMUSCULAR | Status: AC
  Administered 2012-03-20: 60 mg via INTRAMUSCULAR
  Filled 2012-03-20: qty 2

## 2012-03-20 MED ORDER — KETOROLAC TROMETHAMINE 30 MG/ML IJ SOLN: 30.0000 mg | Freq: Four times a day (QID) | INTRAMUSCULAR | Status: AC | PRN

## 2012-03-20 MED ORDER — TETANUS-DIPHTH-ACELL PERTUSSIS 5-2.5-18.5 LF-MCG/0.5 IM SUSP: 0.5000 mL | Freq: Once | INTRAMUSCULAR | Status: DC

## 2012-03-20 MED ORDER — ONDANSETRON HCL 4 MG/2ML IJ SOLN: 4.0000 mg | INTRAMUSCULAR | Status: DC | PRN

## 2012-03-20 MED ORDER — NALBUPHINE SYRINGE 5 MG/0.5 ML
5.0000 mg | INJECTION | INTRAMUSCULAR | Status: DC | PRN
  Filled 2012-03-20 (×2): qty 1

## 2012-03-20 MED ORDER — DIPHENHYDRAMINE HCL 25 MG PO CAPS: 25.0000 mg | ORAL_CAPSULE | Freq: Four times a day (QID) | ORAL | Status: DC | PRN

## 2012-03-20 MED ORDER — SENNOSIDES-DOCUSATE SODIUM 8.6-50 MG PO TABS
2.0000 | ORAL_TABLET | Freq: Every day | ORAL | Status: DC
  Administered 2012-03-20 – 2012-03-21 (×2): 2 via ORAL

## 2012-03-20 MED ORDER — OXYTOCIN 40 UNITS IN LACTATED RINGERS INFUSION - SIMPLE MED: 62.5000 mL/h | INTRAVENOUS | Status: AC

## 2012-03-20 MED ORDER — SCOPOLAMINE 1 MG/3DAYS TD PT72
1.0000 | MEDICATED_PATCH | Freq: Once | TRANSDERMAL | Status: DC
  Administered 2012-03-20: 1.5 mg via TRANSDERMAL

## 2012-03-20 MED ORDER — HYDROMORPHONE HCL PF 1 MG/ML IJ SOLN
0.2500 mg | INTRAMUSCULAR | Status: DC | PRN
  Administered 2012-03-20 (×2): 0.5 mg via INTRAVENOUS

## 2012-03-20 MED ORDER — NALOXONE HCL 0.4 MG/ML IJ SOLN
1.0000 ug/kg/h | INTRAMUSCULAR | Status: DC | PRN
  Filled 2012-03-20: qty 2.5

## 2012-03-20 MED ORDER — DIPHENHYDRAMINE HCL 50 MG/ML IJ SOLN
12.5000 mg | INTRAMUSCULAR | Status: DC | PRN
  Administered 2012-03-20: 12.5 mg via INTRAVENOUS
  Filled 2012-03-20: qty 1

## 2012-03-20 MED ORDER — NALBUPHINE SYRINGE 5 MG/0.5 ML
5.0000 mg | INJECTION | INTRAMUSCULAR | Status: DC | PRN
  Administered 2012-03-20: 10 mg via INTRAVENOUS
  Administered 2012-03-20: 5 mg via INTRAVENOUS
  Filled 2012-03-20 (×2): qty 1

## 2012-03-20 MED ORDER — DIPHENHYDRAMINE HCL 50 MG/ML IJ SOLN: 25.0000 mg | INTRAMUSCULAR | Status: DC | PRN

## 2012-03-20 MED ORDER — IBUPROFEN 600 MG PO TABS
600.0000 mg | ORAL_TABLET | Freq: Four times a day (QID) | ORAL | Status: DC
  Administered 2012-03-20 – 2012-03-22 (×8): 600 mg via ORAL
  Filled 2012-03-20 (×8): qty 1

## 2012-03-20 MED ORDER — DIPHENHYDRAMINE HCL 25 MG PO CAPS: 25.0000 mg | ORAL_CAPSULE | ORAL | Status: DC | PRN

## 2012-03-20 MED ORDER — LANOLIN HYDROUS EX OINT: 1.0000 "application " | TOPICAL_OINTMENT | CUTANEOUS | Status: DC | PRN

## 2012-03-20 MED ORDER — ZOLPIDEM TARTRATE 5 MG PO TABS: 5.0000 mg | ORAL_TABLET | Freq: Every evening | ORAL | Status: DC | PRN

## 2012-03-20 MED ORDER — CEFAZOLIN SODIUM 1-5 GM-% IV SOLN
1.0000 g | Freq: Three times a day (TID) | INTRAVENOUS | Status: AC
Start: 2012-03-20 — End: 2012-03-21
  Administered 2012-03-20 – 2012-03-21 (×3): 1 g via INTRAVENOUS
  Filled 2012-03-20 (×3): qty 50

## 2012-03-20 MED ORDER — ONDANSETRON HCL 4 MG PO TABS: 4.0000 mg | ORAL_TABLET | ORAL | Status: DC | PRN

## 2012-03-20 MED ORDER — HYDROMORPHONE HCL PF 1 MG/ML IJ SOLN
INTRAMUSCULAR | Status: AC
  Administered 2012-03-20: 0.5 mg via INTRAVENOUS
  Filled 2012-03-20: qty 1

## 2012-03-20 MED ORDER — DIBUCAINE 1 % RE OINT: 1.0000 "application " | TOPICAL_OINTMENT | RECTAL | Status: DC | PRN

## 2012-03-20 MED ORDER — MEPERIDINE HCL 25 MG/ML IJ SOLN
INTRAMUSCULAR | Status: AC
  Administered 2012-03-20: 6.25 mg via INTRAVENOUS
  Filled 2012-03-20: qty 1

## 2012-03-20 MED ORDER — SIMETHICONE 80 MG PO CHEW
80.0000 mg | CHEWABLE_TABLET | Freq: Three times a day (TID) | ORAL | Status: DC
  Administered 2012-03-20 – 2012-03-22 (×8): 80 mg via ORAL

## 2012-03-20 MED ORDER — MENTHOL 3 MG MT LOZG: 1.0000 | LOZENGE | OROMUCOSAL | Status: DC | PRN

## 2012-03-20 NOTE — Progress Notes (Signed)
UR Chart review completed.  

## 2012-03-20 NOTE — Plan of Care (Signed)
Problem: Phase II Progression Outcomes Goal: Incision intact & without signs/symptoms of infection Outcome: Completed/Met Date Met:  03/20/12 Internal sutures and steri strips.

## 2012-03-20 NOTE — Progress Notes (Signed)
Patient is eating, ambulating, voiding.  Pain control is good. Appropriate lochia, no complaints.  Filed Vitals:   03/20/12 0245 03/20/12 0345 03/20/12 0445 03/20/12 0545  BP: 148/87 137/81 136/89 130/86  Pulse: 85 96 81 89  Temp: 98.6 F (37 C) 97.7 F (36.5 C) 98.4 F (36.9 C) 98.5 F (36.9 C)  TempSrc: Oral Oral Oral Oral  Resp: 20 20 20 20   Height:      Weight:      SpO2: 97% 96% 96% 97%    Fundus firm Ext: no CT Inc: c/d/i  Lab Results  Component Value Date   WBC 9.4 03/20/2012   HGB 9.3* 03/20/2012   HCT 29.0* 03/20/2012   MCV 74.7* 03/20/2012   PLT 332 03/20/2012    --/--/O POS, O POS (06/26 1220)/RI  A/P Post op day #1  Routine care.   Circ desired, consent obtained after review of risks/benefits.  Philip Aspen

## 2012-03-20 NOTE — Transfer of Care (Signed)
Immediate Anesthesia Transfer of Care Note  Patient: Miranda Huynh  Procedure(s) Performed: Procedure(s) (LRB): CESAREAN SECTION (N/A)  Patient Location: PACU  Anesthesia Type: Epidural  Level of Consciousness: sedated  Airway & Oxygen Therapy: Patient Spontanous Breathing  Post-op Assessment: Report given to PACU RN and Post -op Vital signs reviewed and stable  Post vital signs: stable  Complications: No apparent anesthesia complications

## 2012-03-20 NOTE — Op Note (Addendum)
Pre-Operative Diagnosis: 1) 39+3 week Intrauterine Pregnancy 2) gestational hypertension 3) Maternal morbid obesity 4) Nonreassuring fetal well-being with sustained fetal bradycardia Postoperative Diagnosis: Same Procedure: Emergency primary low transverse cesarean section via Pfannenstiel skin incision Surgeon: Dr. Waynard Reeds Assistant: None Operative Findings: Female infant in Miranda vertex presentation. No obvious explanation for sustained fetal bradycardia. A small laceration on Miranda infant's left cheek was noted after delivery. Likely due to Miranda scalpel upon entry into Miranda uterus. Miranda infant weighed 7 pounds with Apgar scores of 8 and 9. Normal-appearing ovaries, tubes, and uterus. Cord pH 7.032 Specimen: Placenta EBL: Total I/O In: 1000 [I.V.:1000] Out: 950 [Urine:350; Blood:600]   Procedure: Miranda Huynh is an 26 year old gravida 2 para 0010 at 39 weeks and 3 days estimated gestational age who presents for cesarean section. Miranda Huynh was admitted to labor and delivery earlier today for induction of labor for gestational hypertension. She received one dose of Cytotec. Pitocin augmentation was initiated and she was on 2 milliunits of Pitocin. She developed nausea and had an episode of emesis. Following emesis, fetal bradycardia was noted with Miranda fetal heart tones in Miranda 70s. Fetal heart tones briefly improved to 110 and then returned Miranda 70s without recovery. Miranda decision was made to proceed to Miranda operating room. Once in Miranda operating room heart tones were again confirmed to be in Miranda 70s and Miranda decision was made to proceed with emergency cesarean section for sustained fetal bradycardia. Miranda Huynh was verbally consented for Miranda procedure. In Miranda operating room, adequate surgical anesthesia was confirmed with an Allis test. She was placed in Miranda dorsal supine position with a leftward tilt. A Foley catheter was placed in Miranda bladder. She was prepped and draped in Miranda normal sterile fashion. Scalpel  was then used to make a Pfannenstiel skin incision which was carried down to Miranda underlying layers of soft tissue to Miranda fascia. Miranda fascia was incised in Miranda midline and Miranda fascial incision was extended Bluntly.  Miranda rectus muscles were separated in Miranda midline. Miranda abdominal peritoneum was identified, And entered with blunt dissection.  Miranda bladder blade was then inserted and a lower uterine segment was identified. Miranda scalpel was then used to make a low transverse incision on Miranda uterus which was extended laterally with Blunt dissection. Miranda fetal vertex was identified, delivered easily through Miranda uterine incision with Miranda assistance of a vacuum extractor followed by Miranda body. Miranda fetal scalp electrode was still attached to Miranda infant's head was delivered through Miranda uterine incision with delivery of Miranda baby. Miranda cord was clamped and cut and Miranda infant was passed to Miranda waiting neonatologist. Placenta was then delivered spontaneously, And a cord pH and cord blood were collected. Miranda uterus was cleared of all clot and debris. Miranda uterine incision was repaired with #1 chromic in running locked fashion followed by a second imbricating layer. Ovaries and tubes were inspected and normal. Miranda uterus was returned to Miranda abdominal cavity Miranda abdominal cavity was cleared of all clot and debris. Miranda abdominal peritoneum was reapproximated with 2-0 Vicryl in a running fashion, Miranda rectus muscles was reapproximated with #1 chromic in a running fashion. Miranda fascia was closed with a looped PDS in a running fashion. Miranda subcutaneous layer was reapproximated with 2-0 plain gut with interrupted suture. Miranda skin was closed with 4-0 Vicryl on a Keith needle in a subcuticular fashion and Dermabond. All sponge lap and needle counts were correct x2. Huynh tolerated Miranda procedure well and recovered in  stable condition following Miranda procedure. Miranda Huynh did receive one dose of Ancef at Miranda beginning of Miranda case. Given Miranda  emergent fashion of Miranda case, and Miranda less than ideal sterile conditions of Miranda case Miranda Huynh will be continued postoperatively for 24 hours on IV antibiotics.

## 2012-03-20 NOTE — Addendum Note (Signed)
Addendum  created 03/20/12 1002 by Suella Grove, CRNA   Modules edited:Anesthesia Events, Charges VN

## 2012-03-20 NOTE — Anesthesia Postprocedure Evaluation (Signed)
  Anesthesia Post-op Note  Patient: Miranda Huynh  Procedure(s) Performed: Procedure(s) (LRB): CESAREAN SECTION (N/A)  Patient is awake, responsive, moving her legs, and has signs of resolution of her numbness. Pain and nausea are reasonably well controlled. Vital signs are stable and clinically acceptable. Oxygen saturation is clinically acceptable. There are no apparent anesthetic complications at this time. Patient is ready for discharge.

## 2012-03-21 NOTE — Progress Notes (Signed)
Post Op Day 2 Subjective: no complaints  Objective: Blood pressure 139/86, pulse 96, temperature 98.8 F (37.1 C), temperature source Oral, resp. rate 20, height 5\' 8"  (1.727 m), weight 309 lb (140.161 kg), last menstrual period 06/17/2011, SpO2 98.00%, unknown if currently breastfeeding.  Physical Exam:  General: alert Lochia: appropriate Uterine Fundus: firm Incision: healing well   Basename 03/20/12 0515 03/20/12 0035  HGB 9.3* 9.6*  HCT 29.0* 30.3*    Assessment/Plan: Plan for discharge tomorrow   LOS: 2 days   Miranda Huynh D 03/21/2012, 9:53 AM

## 2012-03-22 MED ORDER — OXYCODONE-ACETAMINOPHEN 5-325 MG PO TABS: 1.0000 | ORAL_TABLET | ORAL | Status: AC | PRN

## 2012-03-22 NOTE — Discharge Summary (Signed)
Obstetric Discharge Summary Reason for Admission: induction of labor Prenatal Procedures: Preeclampsia and ultrasound Intrapartum Procedures: cesarean: low cervical, transverse Postpartum Procedures: none Complications-Operative and Postpartum: none Hemoglobin  Date Value Range Status  03/20/2012 9.3* 12.0 - 15.0 g/dL Final     HCT  Date Value Range Status  03/20/2012 29.0* 36.0 - 46.0 % Final    Physical Exam:  General: alert Lochia: appropriate Uterine Fundus: firm Incision: healing well  Discharge Diagnoses: Term Pregnancy-delivered, Preelampsia, Prolonged fetal bradycardia  Discharge Information: Date: 03/22/2012 Activity: Limited Diet: routine Medications: PNV, Ibuprofen and Percocet Condition: stable Instructions: refer to practice specific booklet Discharge to: home Follow-up Information    Follow up with Miranda Huynh., MD in 4 weeks.   Contact information:   [address] [place] [address] [phone]          Newborn Data: Live born female  Birth Weight: 7 lb (3175 g) APGAR: 8, 9  Home with mother.  Miranda Huynh D 03/22/2012, 8:53 AM

## 2012-03-22 NOTE — Discharge Instructions (Signed)

## 2012-03-23 LAB — TYPE AND SCREEN
ABO/RH(D): O POS
Unit division: 0
Unit division: 0

## 2014-07-26 ENCOUNTER — Encounter (HOSPITAL_COMMUNITY): Payer: Self-pay | Admitting: *Deleted

## 2019-11-06 ENCOUNTER — Other Ambulatory Visit (HOSPITAL_COMMUNITY): Payer: Self-pay

## 2019-11-06 NOTE — Discharge Instructions (Signed)

## 2019-11-09 ENCOUNTER — Other Ambulatory Visit: Payer: Self-pay

## 2019-11-09 ENCOUNTER — Encounter (HOSPITAL_COMMUNITY)
Admission: RE | Admit: 2019-11-09 | Discharge: 2019-11-09 | Disposition: A | Discharge status: Home / Self Care | Payer: 59 | Source: Ambulatory Visit | Attending: Obstetrics and Gynecology | Admitting: Obstetrics and Gynecology

## 2019-11-09 DIAGNOSIS — D649 Anemia, unspecified: Secondary | ICD-10-CM | POA: Insufficient documentation

## 2019-11-09 MED ORDER — SODIUM CHLORIDE 0.9 % IV SOLN
510.0000 mg | INTRAVENOUS | Status: DC
  Administered 2019-11-09: 510 mg via INTRAVENOUS
  Filled 2019-11-09: qty 17

## 2019-11-16 ENCOUNTER — Other Ambulatory Visit: Payer: Self-pay

## 2019-11-16 ENCOUNTER — Encounter (HOSPITAL_COMMUNITY)
Admission: RE | Admit: 2019-11-16 | Discharge: 2019-11-16 | Disposition: A | Discharge status: Home / Self Care | Payer: 59 | Source: Ambulatory Visit | Attending: Obstetrics and Gynecology | Admitting: Obstetrics and Gynecology

## 2019-11-16 DIAGNOSIS — D649 Anemia, unspecified: Secondary | ICD-10-CM | POA: Diagnosis not present

## 2019-11-16 MED ORDER — SODIUM CHLORIDE 0.9 % IV SOLN
510.0000 mg | INTRAVENOUS | Status: DC
  Administered 2019-11-16: 510 mg via INTRAVENOUS
  Filled 2019-11-16: qty 510

## 2019-11-20 ENCOUNTER — Encounter (HOSPITAL_COMMUNITY): Payer: Self-pay | Admitting: Obstetrics

## 2019-11-20 ENCOUNTER — Inpatient Hospital Stay (HOSPITAL_COMMUNITY): Admit: 2019-11-20 | Payer: 59 | Admitting: Obstetrics and Gynecology

## 2019-11-20 ENCOUNTER — Encounter (HOSPITAL_COMMUNITY)
Admission: AD | Disposition: A | Discharge status: Home / Self Care | Payer: Self-pay | Source: Home / Self Care | Attending: Obstetrics

## 2019-11-20 ENCOUNTER — Inpatient Hospital Stay (HOSPITAL_COMMUNITY): Payer: 59 | Admitting: Certified Registered Nurse Anesthetist

## 2019-11-20 ENCOUNTER — Inpatient Hospital Stay (HOSPITAL_COMMUNITY)
Admission: AD | Admit: 2019-11-20 | Discharge: 2019-11-23 | DRG: 787 | Disposition: A | Discharge status: Home / Self Care | Payer: 59 | Attending: Obstetrics | Admitting: Obstetrics

## 2019-11-20 ENCOUNTER — Other Ambulatory Visit: Payer: Self-pay

## 2019-11-20 DIAGNOSIS — Z20822 Contact with and (suspected) exposure to covid-19: Secondary | ICD-10-CM | POA: Diagnosis present

## 2019-11-20 DIAGNOSIS — O134 Gestational [pregnancy-induced] hypertension without significant proteinuria, complicating childbirth: Secondary | ICD-10-CM | POA: Diagnosis present

## 2019-11-20 DIAGNOSIS — D259 Leiomyoma of uterus, unspecified: Secondary | ICD-10-CM | POA: Diagnosis present

## 2019-11-20 DIAGNOSIS — O4103X0 Oligohydramnios, third trimester, not applicable or unspecified: Secondary | ICD-10-CM | POA: Diagnosis present

## 2019-11-20 DIAGNOSIS — D509 Iron deficiency anemia, unspecified: Secondary | ICD-10-CM | POA: Diagnosis present

## 2019-11-20 DIAGNOSIS — O3413 Maternal care for benign tumor of corpus uteri, third trimester: Secondary | ICD-10-CM | POA: Diagnosis present

## 2019-11-20 DIAGNOSIS — Z3A37 37 weeks gestation of pregnancy: Secondary | ICD-10-CM | POA: Diagnosis not present

## 2019-11-20 DIAGNOSIS — O9902 Anemia complicating childbirth: Secondary | ICD-10-CM | POA: Diagnosis present

## 2019-11-20 DIAGNOSIS — O34211 Maternal care for low transverse scar from previous cesarean delivery: Secondary | ICD-10-CM | POA: Diagnosis present

## 2019-11-20 DIAGNOSIS — O99214 Obesity complicating childbirth: Secondary | ICD-10-CM | POA: Diagnosis present

## 2019-11-20 DIAGNOSIS — O133 Gestational [pregnancy-induced] hypertension without significant proteinuria, third trimester: Secondary | ICD-10-CM | POA: Diagnosis present

## 2019-11-20 LAB — CBC WITH DIFFERENTIAL/PLATELET
Abs Immature Granulocytes: 0.03 10*3/uL (ref 0.00–0.07)
Basophils Absolute: 0 10*3/uL (ref 0.0–0.1)
Basophils Relative: 0 %
Eosinophils Absolute: 0 10*3/uL (ref 0.0–0.5)
Eosinophils Relative: 0 %
HCT: 33.1 % — ABNORMAL LOW (ref 36.0–46.0)
Hemoglobin: 10.2 g/dL — ABNORMAL LOW (ref 12.0–15.0)
Immature Granulocytes: 1 %
Lymphocytes Relative: 31 %
Lymphs Abs: 1.8 10*3/uL (ref 0.7–4.0)
MCH: 24.1 pg — ABNORMAL LOW (ref 26.0–34.0)
MCHC: 30.8 g/dL (ref 30.0–36.0)
MCV: 78.3 fL — ABNORMAL LOW (ref 80.0–100.0)
Monocytes Absolute: 0.4 10*3/uL (ref 0.1–1.0)
Monocytes Relative: 7 %
Neutro Abs: 3.5 10*3/uL (ref 1.7–7.7)
Neutrophils Relative %: 61 %
Platelets: 329 10*3/uL (ref 150–400)
RBC: 4.23 MIL/uL (ref 3.87–5.11)
RDW: 17.6 % — ABNORMAL HIGH (ref 11.5–15.5)
WBC: 5.7 10*3/uL (ref 4.0–10.5)
nRBC: 0 % (ref 0.0–0.2)

## 2019-11-20 LAB — COMPREHENSIVE METABOLIC PANEL
ALT: 15 U/L (ref 0–44)
AST: 18 U/L (ref 15–41)
Albumin: 3.1 g/dL — ABNORMAL LOW (ref 3.5–5.0)
Alkaline Phosphatase: 152 U/L — ABNORMAL HIGH (ref 38–126)
Anion gap: 10 (ref 5–15)
BUN: 5 mg/dL — ABNORMAL LOW (ref 6–20)
CO2: 20 mmol/L — ABNORMAL LOW (ref 22–32)
Calcium: 9.3 mg/dL (ref 8.9–10.3)
Chloride: 108 mmol/L (ref 98–111)
Creatinine, Ser: 0.87 mg/dL (ref 0.44–1.00)
GFR calc Af Amer: >60 mL/min (ref >60)
GFR calc non Af Amer: >60 mL/min (ref >60)
Glucose, Bld: 88 mg/dL (ref 70–99)
Potassium: 3.5 mmol/L (ref 3.5–5.1)
Sodium: 138 mmol/L (ref 135–145)
Total Bilirubin: 0.6 mg/dL (ref 0.3–1.2)
Total Protein: 7 g/dL (ref 6.5–8.1)

## 2019-11-20 LAB — RESPIRATORY PANEL BY RT PCR (FLU A&B, COVID)
Influenza A by PCR: NEGATIVE
Influenza B by PCR: NEGATIVE
SARS Coronavirus 2 by RT PCR: NEGATIVE

## 2019-11-20 LAB — TYPE AND SCREEN
ABO/RH(D): O POS
Antibody Screen: NEGATIVE

## 2019-11-20 LAB — ABO/RH: ABO/RH(D): O POS

## 2019-11-20 SURGERY — Surgical Case
Anesthesia: Spinal

## 2019-11-20 MED ORDER — ONDANSETRON HCL 4 MG/2ML IJ SOLN: 4.0000 mg | Freq: Four times a day (QID) | INTRAMUSCULAR | Status: DC | PRN

## 2019-11-20 MED ORDER — OXYCODONE HCL 5 MG PO TABS
5.0000 mg | ORAL_TABLET | ORAL | Status: DC | PRN
  Administered 2019-11-21 – 2019-11-22 (×3): 10 mg via ORAL
  Administered 2019-11-22 (×2): 5 mg via ORAL
  Administered 2019-11-23 (×2): 10 mg via ORAL
  Filled 2019-11-20: qty 2
  Filled 2019-11-20: qty 1
  Filled 2019-11-20 (×3): qty 2
  Filled 2019-11-20: qty 1
  Filled 2019-11-20: qty 2

## 2019-11-20 MED ORDER — DEXTROSE 5 % IV SOLN
INTRAVENOUS | Status: AC
  Filled 2019-11-20: qty 3000

## 2019-11-20 MED ORDER — NALBUPHINE HCL 10 MG/ML IJ SOLN: 5.0000 mg | INTRAMUSCULAR | Status: DC | PRN

## 2019-11-20 MED ORDER — KETOROLAC TROMETHAMINE 30 MG/ML IJ SOLN
30.0000 mg | Freq: Four times a day (QID) | INTRAMUSCULAR | Status: AC | PRN
  Administered 2019-11-20: 30 mg via INTRAVENOUS

## 2019-11-20 MED ORDER — ONDANSETRON HCL 4 MG/2ML IJ SOLN
INTRAMUSCULAR | Status: AC
  Filled 2019-11-20: qty 2

## 2019-11-20 MED ORDER — MENTHOL 3 MG MT LOZG: 1.0000 | LOZENGE | OROMUCOSAL | Status: DC | PRN

## 2019-11-20 MED ORDER — SCOPOLAMINE 1 MG/3DAYS TD PT72
1.0000 | MEDICATED_PATCH | Freq: Once | TRANSDERMAL | Status: AC
  Administered 2019-11-20: 1.5 mg via TRANSDERMAL

## 2019-11-20 MED ORDER — SODIUM CHLORIDE 0.9% FLUSH: 3.0000 mL | INTRAVENOUS | Status: DC | PRN

## 2019-11-20 MED ORDER — ENOXAPARIN SODIUM 80 MG/0.8ML ~~LOC~~ SOLN
70.0000 mg | SUBCUTANEOUS | Status: DC
  Administered 2019-11-21 – 2019-11-22 (×2): 70 mg via SUBCUTANEOUS
  Filled 2019-11-20 (×2): qty 0.8

## 2019-11-20 MED ORDER — OXYCODONE HCL 5 MG/5ML PO SOLN: 5.0000 mg | Freq: Once | ORAL | Status: DC | PRN

## 2019-11-20 MED ORDER — ONDANSETRON HCL 4 MG/2ML IJ SOLN: 4.0000 mg | Freq: Three times a day (TID) | INTRAMUSCULAR | Status: DC | PRN

## 2019-11-20 MED ORDER — KETOROLAC TROMETHAMINE 30 MG/ML IJ SOLN
INTRAMUSCULAR | Status: AC
  Filled 2019-11-20: qty 1

## 2019-11-20 MED ORDER — PRENATAL MULTIVITAMIN CH
1.0000 | ORAL_TABLET | Freq: Every day | ORAL | Status: DC
  Administered 2019-11-21 – 2019-11-22 (×2): 1 via ORAL
  Filled 2019-11-20 (×2): qty 1

## 2019-11-20 MED ORDER — STERILE WATER FOR IRRIGATION IR SOLN
Status: DC | PRN
  Administered 2019-11-20: 1000 mL

## 2019-11-20 MED ORDER — SODIUM CHLORIDE 0.9 % IR SOLN
Status: DC | PRN
  Administered 2019-11-20: 1

## 2019-11-20 MED ORDER — DIPHENHYDRAMINE HCL 25 MG PO CAPS: 25.0000 mg | ORAL_CAPSULE | ORAL | Status: DC | PRN

## 2019-11-20 MED ORDER — NALOXONE HCL 0.4 MG/ML IJ SOLN: 0.4000 mg | INTRAMUSCULAR | Status: DC | PRN

## 2019-11-20 MED ORDER — KETOROLAC TROMETHAMINE 30 MG/ML IJ SOLN
30.0000 mg | Freq: Four times a day (QID) | INTRAMUSCULAR | Status: AC
  Administered 2019-11-20 – 2019-11-21 (×3): 30 mg via INTRAVENOUS
  Filled 2019-11-20 (×3): qty 1

## 2019-11-20 MED ORDER — NALBUPHINE HCL 10 MG/ML IJ SOLN
5.0000 mg | INTRAMUSCULAR | Status: DC | PRN
  Filled 2019-11-20: qty 1

## 2019-11-20 MED ORDER — DIPHENHYDRAMINE HCL 25 MG PO CAPS: 25.0000 mg | ORAL_CAPSULE | Freq: Four times a day (QID) | ORAL | Status: DC | PRN

## 2019-11-20 MED ORDER — OXYTOCIN 40 UNITS IN NORMAL SALINE INFUSION - SIMPLE MED
INTRAVENOUS | Status: AC
  Filled 2019-11-20: qty 1000

## 2019-11-20 MED ORDER — KETOROLAC TROMETHAMINE 30 MG/ML IJ SOLN: 30.0000 mg | Freq: Four times a day (QID) | INTRAMUSCULAR | Status: AC | PRN

## 2019-11-20 MED ORDER — COCONUT OIL OIL: 1.0000 "application " | TOPICAL_OIL | Status: DC | PRN

## 2019-11-20 MED ORDER — MEPERIDINE HCL 25 MG/ML IJ SOLN: 6.2500 mg | INTRAMUSCULAR | Status: DC | PRN

## 2019-11-20 MED ORDER — ONDANSETRON HCL 4 MG/2ML IJ SOLN
INTRAMUSCULAR | Status: DC | PRN
  Administered 2019-11-20: 4 mg via INTRAVENOUS

## 2019-11-20 MED ORDER — SIMETHICONE 80 MG PO CHEW
80.0000 mg | CHEWABLE_TABLET | ORAL | Status: DC
  Administered 2019-11-20 – 2019-11-22 (×3): 80 mg via ORAL
  Filled 2019-11-20 (×3): qty 1

## 2019-11-20 MED ORDER — SIMETHICONE 80 MG PO CHEW
80.0000 mg | CHEWABLE_TABLET | Freq: Three times a day (TID) | ORAL | Status: DC
  Administered 2019-11-20 – 2019-11-22 (×6): 80 mg via ORAL
  Filled 2019-11-20 (×7): qty 1

## 2019-11-20 MED ORDER — DIBUCAINE (PERIANAL) 1 % EX OINT: 1.0000 "application " | TOPICAL_OINTMENT | CUTANEOUS | Status: DC | PRN

## 2019-11-20 MED ORDER — NALBUPHINE HCL 10 MG/ML IJ SOLN
5.0000 mg | Freq: Once | INTRAMUSCULAR | Status: AC | PRN
  Administered 2019-11-20: 5 mg via INTRAVENOUS

## 2019-11-20 MED ORDER — NALOXONE HCL 4 MG/10ML IJ SOLN
1.0000 ug/kg/h | INTRAVENOUS | Status: DC | PRN
  Filled 2019-11-20: qty 5

## 2019-11-20 MED ORDER — LACTATED RINGERS IV SOLN: INTRAVENOUS | Status: DC

## 2019-11-20 MED ORDER — IBUPROFEN 800 MG PO TABS
800.0000 mg | ORAL_TABLET | Freq: Four times a day (QID) | ORAL | Status: DC
  Administered 2019-11-21 – 2019-11-23 (×7): 800 mg via ORAL
  Filled 2019-11-20 (×7): qty 1

## 2019-11-20 MED ORDER — MORPHINE SULFATE (PF) 0.5 MG/ML IJ SOLN
INTRAMUSCULAR | Status: DC | PRN
  Administered 2019-11-20: .15 mg via INTRATHECAL

## 2019-11-20 MED ORDER — DIPHENHYDRAMINE HCL 50 MG/ML IJ SOLN: 12.5000 mg | INTRAMUSCULAR | Status: DC | PRN

## 2019-11-20 MED ORDER — SENNOSIDES-DOCUSATE SODIUM 8.6-50 MG PO TABS
2.0000 | ORAL_TABLET | ORAL | Status: DC
  Administered 2019-11-20 – 2019-11-22 (×3): 2 via ORAL
  Filled 2019-11-20 (×3): qty 2

## 2019-11-20 MED ORDER — DEXTROSE 5 % IV SOLN
3.0000 g | Freq: Once | INTRAVENOUS | Status: AC
  Administered 2019-11-20: 14:00:00 3 g via INTRAVENOUS

## 2019-11-20 MED ORDER — FENTANYL CITRATE (PF) 100 MCG/2ML IJ SOLN
INTRAMUSCULAR | Status: DC | PRN
  Administered 2019-11-20: 15 ug via INTRATHECAL

## 2019-11-20 MED ORDER — PHENYLEPHRINE HCL-NACL 20-0.9 MG/250ML-% IV SOLN
INTRAVENOUS | Status: DC | PRN
  Administered 2019-11-20: 60 ug/min via INTRAVENOUS

## 2019-11-20 MED ORDER — NALBUPHINE HCL 10 MG/ML IJ SOLN: 5.0000 mg | Freq: Once | INTRAMUSCULAR | Status: AC | PRN

## 2019-11-20 MED ORDER — OXYCODONE HCL 5 MG PO TABS: 5.0000 mg | ORAL_TABLET | Freq: Once | ORAL | Status: DC | PRN

## 2019-11-20 MED ORDER — BUPIVACAINE IN DEXTROSE 0.75-8.25 % IT SOLN
INTRATHECAL | Status: DC | PRN
  Administered 2019-11-20: 1.6 mL via INTRATHECAL

## 2019-11-20 MED ORDER — WITCH HAZEL-GLYCERIN EX PADS: 1.0000 "application " | MEDICATED_PAD | CUTANEOUS | Status: DC | PRN

## 2019-11-20 MED ORDER — MORPHINE SULFATE (PF) 0.5 MG/ML IJ SOLN
INTRAMUSCULAR | Status: AC
  Filled 2019-11-20: qty 10

## 2019-11-20 MED ORDER — OXYTOCIN 40 UNITS IN NORMAL SALINE INFUSION - SIMPLE MED
2.5000 [IU]/h | INTRAVENOUS | Status: AC
  Administered 2019-11-20: 2.5 [IU]/h via INTRAVENOUS

## 2019-11-20 MED ORDER — ACETAMINOPHEN 500 MG PO TABS
1000.0000 mg | ORAL_TABLET | Freq: Four times a day (QID) | ORAL | Status: DC
  Administered 2019-11-20 – 2019-11-23 (×11): 1000 mg via ORAL
  Filled 2019-11-20 (×11): qty 2

## 2019-11-20 MED ORDER — SODIUM CHLORIDE 0.9 % IV SOLN: INTRAVENOUS | Status: DC | PRN

## 2019-11-20 MED ORDER — OXYTOCIN 40 UNITS IN NORMAL SALINE INFUSION - SIMPLE MED
INTRAVENOUS | Status: DC | PRN
  Administered 2019-11-20: 40 [IU] via INTRAVENOUS

## 2019-11-20 MED ORDER — SCOPOLAMINE 1 MG/3DAYS TD PT72
MEDICATED_PATCH | TRANSDERMAL | Status: AC
  Filled 2019-11-20: qty 1

## 2019-11-20 MED ORDER — FENTANYL CITRATE (PF) 100 MCG/2ML IJ SOLN: 25.0000 ug | INTRAMUSCULAR | Status: DC | PRN

## 2019-11-20 MED ORDER — FENTANYL CITRATE (PF) 100 MCG/2ML IJ SOLN
INTRAMUSCULAR | Status: AC
  Filled 2019-11-20: qty 2

## 2019-11-20 MED ORDER — PHENYLEPHRINE HCL-NACL 20-0.9 MG/250ML-% IV SOLN
INTRAVENOUS | Status: AC
  Filled 2019-11-20: qty 250

## 2019-11-20 MED ORDER — SIMETHICONE 80 MG PO CHEW: 80.0000 mg | CHEWABLE_TABLET | ORAL | Status: DC | PRN

## 2019-11-20 SURGICAL SUPPLY — 46 items
APL SKNCLS STERI-STRIP NONHPOA (GAUZE/BANDAGES/DRESSINGS) ×1
BENZOIN TINCTURE PRP APPL 2/3 (GAUZE/BANDAGES/DRESSINGS) ×3 IMPLANT
CHLORAPREP W/TINT 26ML (MISCELLANEOUS) ×3 IMPLANT
CLAMP CORD UMBIL (MISCELLANEOUS) IMPLANT
CLOSURE STERI STRIP 1/2 X4 (GAUZE/BANDAGES/DRESSINGS) ×1 IMPLANT
CLOSURE WOUND 1/2 X4 (GAUZE/BANDAGES/DRESSINGS) ×1
CLOTH BEACON ORANGE TIMEOUT ST (SAFETY) ×3 IMPLANT
DRSG OPSITE POSTOP 4X10 (GAUZE/BANDAGES/DRESSINGS) ×3 IMPLANT
ELECT REM PT RETURN 9FT ADLT (ELECTROSURGICAL) ×3
ELECTRODE REM PT RTRN 9FT ADLT (ELECTROSURGICAL) ×1 IMPLANT
EXTRACTOR VACUUM KIWI (MISCELLANEOUS) IMPLANT
GAUZE SPONGE 4X4 12PLY STRL LF (GAUZE/BANDAGES/DRESSINGS) ×4 IMPLANT
GLOVE BIOGEL PI IND STRL 6.5 (GLOVE) ×1 IMPLANT
GLOVE BIOGEL PI IND STRL 7.0 (GLOVE) ×1 IMPLANT
GLOVE BIOGEL PI INDICATOR 6.5 (GLOVE) ×2
GLOVE BIOGEL PI INDICATOR 7.0 (GLOVE) ×2
GLOVE ECLIPSE 6.0 STRL STRAW (GLOVE) ×3 IMPLANT
GOWN STRL REUS W/TWL LRG LVL3 (GOWN DISPOSABLE) ×6 IMPLANT
HOVERMATT SINGLE USE (MISCELLANEOUS) ×2 IMPLANT
KIT ABG SYR 3ML LUER SLIP (SYRINGE) IMPLANT
NDL HYPO 25X5/8 SAFETYGLIDE (NEEDLE) IMPLANT
NEEDLE HYPO 25X5/8 SAFETYGLIDE (NEEDLE) IMPLANT
NS IRRIG 1000ML POUR BTL (IV SOLUTION) ×3 IMPLANT
PACK C SECTION WH (CUSTOM PROCEDURE TRAY) ×3 IMPLANT
PAD ABD 7.5X8 STRL (GAUZE/BANDAGES/DRESSINGS) ×2 IMPLANT
PAD OB MATERNITY 4.3X12.25 (PERSONAL CARE ITEMS) ×3 IMPLANT
PENCIL SMOKE EVAC W/HOLSTER (ELECTROSURGICAL) ×3 IMPLANT
RETAINER VISCERAL (MISCELLANEOUS) ×2 IMPLANT
RETRACTOR TRAXI PANNICULUS (MISCELLANEOUS) IMPLANT
RTRCTR C-SECT PINK 25CM LRG (MISCELLANEOUS) ×3 IMPLANT
STRIP CLOSURE SKIN 1/2X4 (GAUZE/BANDAGES/DRESSINGS) ×2 IMPLANT
SUT MNCRL 0 VIOLET CTX 36 (SUTURE) ×2 IMPLANT
SUT MNCRL AB 3-0 PS2 27 (SUTURE) ×2 IMPLANT
SUT MONOCRYL 0 CTX 36 (SUTURE) ×4
SUT PLAIN 0 NONE (SUTURE) IMPLANT
SUT PLAIN 2 0 (SUTURE) ×3
SUT PLAIN ABS 2-0 CT1 27XMFL (SUTURE) ×1 IMPLANT
SUT VIC AB 0 CTX 36 (SUTURE) ×6
SUT VIC AB 0 CTX36XBRD ANBCTRL (SUTURE) ×2 IMPLANT
SUT VIC AB 2-0 CT1 27 (SUTURE) ×3
SUT VIC AB 2-0 CT1 TAPERPNT 27 (SUTURE) ×1 IMPLANT
SUT VIC AB 4-0 KS 27 (SUTURE) ×3 IMPLANT
TOWEL OR 17X24 6PK STRL BLUE (TOWEL DISPOSABLE) ×3 IMPLANT
TRAXI PANNICULUS RETRACTOR (MISCELLANEOUS) ×2
TRAY FOLEY W/BAG SLVR 14FR LF (SET/KITS/TRAYS/PACK) ×3 IMPLANT
WATER STERILE IRR 1000ML POUR (IV SOLUTION) ×3 IMPLANT

## 2019-11-20 NOTE — Anesthesia Procedure Notes (Signed)
Spinal  Patient location during procedure: OR Start time: 11/20/2019 1:45 PM End time: 11/20/2019 1:48 PM Staffing Performed: anesthesiologist  Anesthesiologist: Albertha Ghee, MD Preanesthetic Checklist Completed: patient identified, IV checked, risks and benefits discussed, surgical consent, monitors and equipment checked, pre-op evaluation and timeout performed Spinal Block Patient position: sitting Prep: DuraPrep Patient monitoring: cardiac monitor, continuous pulse ox and blood pressure Approach: midline Location: L3-4 Injection technique: single-shot Needle Needle type: Pencan  Needle gauge: 24 G Needle length: 9 cm Assessment Sensory level: T10 Additional Notes Functioning IV was confirmed and monitors were applied. Sterile prep and drape, including hand hygiene and sterile gloves were used. The patient was positioned and the spine was prepped. The skin was anesthetized with lidocaine.  Free flow of clear CSF was obtained prior to injecting local anesthetic into the CSF.  The spinal needle aspirated freely following injection.  The needle was carefully withdrawn.  The patient tolerated the procedure well.

## 2019-11-20 NOTE — Op Note (Addendum)
Cesarean Section Procedure Note  Pre-operative Diagnosis: 1. Intrauterine pregnancy at [redacted]w[redacted]d  2. History of cesarean section, desires repeat  3. Gestational hypertension  4.  Oligohydramnios  5. Morbid obesity    Post-operative Diagnosis: same as above  Surgeon: Jerelyn Charles, MD  Assistants: Claretta Fraise, RNFA  Procedure: Repeat low transverse cesarean section   Anesthesia: Spinal anesthesia  Estimated Blood Loss: 271 mL         Drains: Foley catheter         Specimens: None              Complications:  None; patient tolerated the procedure well.         Disposition: PACU - hemodynamically stable.  Findings:  Moderate adhesions of the subcutaneous layer.  Upon entry into the peritoneal caivty, the omentum was adhered to the anterior abdominal wall in multiple places.  Bladder was reflected to the midportion of the uterus.  Normal uterus, tubes and ovaries bilaterally.  Viable female infant, female  2784 g (6lb 2.2oz) Apgars 8, 9.    Procedure Details   After spinal  anesthesia was found to adequate, the patient was placed in the dorsal supine position with a leftward tilt, prepped and draped in the usual sterile manner. Prior to prepping, a Traxi pannus retractor was placed.  A Pfannenstiel incision was made and carried down through the subcutaneous tissue to the fascia. There were moderate thick adhesions of the subcutaneous layer. The fascia was incised in the midline with the scalpel and the fascial incision was extended laterally with Mayo scissors. The superior aspect of the fascial incision was grasped with two Kocher clamps, tented up and the rectus muscles dissected off sharply. The rectus was then dissected off with blunt dissection and Mayo scissors inferiorly. The rectus muscles were separated in the midline with sharp dissection. The abdominal peritoneum was identified, tented up, entered sharply.  There were multiple adhesions of the omentum to the anterior abdominal wall.   Each adhesion grasped with two kelley clamps and dissected with bovie cautery.  When the omentum was freed, it was retracted superior and the uterus was visualized.  The abdominal peritoneum was then stretched superiorly and inferiorly with good visualization of the bladder. The Alexis retractor was deployed. The vesicouterine peritoneum was identified, tented up, entered sharply, and the bladder flap was created digitally. A scalpel was then used to make a low transverse incision on the uterus which was extended in the cephalad-caudad direction with blunt dissection and the bandage scissors. The fluid was clear. The fetal vertex was identified, elevated out of the pelvis and brought to the hysterotomy.  The head was delivered easily followed by the shoulders and body.  After a 60 second delay per protocol, the cord was clamped and cut and the infant was passed to the waiting neonatologist.  The placenta was then delivered spontaneously, intact and appear normal, the uterus was cleared of all clot and debris   The hysterotomy was repaired with #0 Monocryl in running locked fashion.  A second imbricating layer of #0 Monocryl was placed.  A figure of eight suture was placed at the right aspect of the hysterotomy, and excellent hemostasis was noted.  The Alexis retractor was removed from the abdomen. The peritoneum was examined and all vessels noted to be hemostatic. The abdominal cavity was cleared of all clot and debris.  The peritoneum was retracted too far laterally on the right to close.  The fascia and rectus muscles were  inspected.  Hemostasis was obtained with bovie cautery. The fascia was closed with 0 Vicryl in a running fashion. The subcutaneous layer was irrigated and all bleeders cauterized. The subcutaneous layer was closed with interrupted plain gut. The skin was closed with 3-0 monocryl in a subcuticular fashion. The incision was dressed with benzoine, steri strips and honeycomb dressing. All sponge  lap and needle counts were correct x3. Patient tolerated the procedure well and recovered in stable condition following the procedure.

## 2019-11-20 NOTE — Anesthesia Preprocedure Evaluation (Signed)
Anesthesia Evaluation  Patient identified by MRN, date of birth, ID band Patient awake    Reviewed: Allergy & Precautions, H&P , NPO status , Patient's Chart, lab work & pertinent test results  Airway Mallampati: II   Neck ROM: full    Dental   Pulmonary neg pulmonary ROS,    breath sounds clear to auscultation       Cardiovascular negative cardio ROS   Rhythm:regular Rate:Normal     Neuro/Psych    GI/Hepatic   Endo/Other  Morbid obesity  Renal/GU      Musculoskeletal   Abdominal   Peds  Hematology   Anesthesia Other Findings   Reproductive/Obstetrics (+) Pregnancy H/o prior C/S                             Anesthesia Physical Anesthesia Plan  ASA: II  Anesthesia Plan: Spinal   Post-op Pain Management:    Induction: Intravenous  PONV Risk Score and Plan: 2 and Ondansetron and Treatment may vary due to age or medical condition  Airway Management Planned: Simple Face Mask  Additional Equipment:   Intra-op Plan:   Post-operative Plan:   Informed Consent: I have reviewed the patients History and Physical, chart, labs and discussed the procedure including the risks, benefits and alternatives for the proposed anesthesia with the patient or authorized representative who has indicated his/her understanding and acceptance.       Plan Discussed with: CRNA, Anesthesiologist and Surgeon  Anesthesia Plan Comments:         Anesthesia Quick Evaluation

## 2019-11-20 NOTE — H&P (Signed)
34 y.o. G3P1011 @ [redacted]w[redacted]d presents for repeat cesarean section.  She was seen in the office today for a routine visit with a growth ultrasound.  Her blood pressure was noted to be elevated at 140/90 and ultrasound showed a new diagnosis of oligohydramnios (AFI 2.3).  Given gestational age > 29 weeks with gestational hypertension and oligohydramnios, Dr. Philis Pique has admitted her for repeat cesarean section.  She denies headache / right upper quadrant or epigastric pain / visual changes.  Notes some increased fatigue over the past week.  Otherwise has good fetal movement and no bleeding.  Pregnancy c/b: 1. Advanced maternal age: declined genetic screening 2. History of cesarean section:  Desires repeat 3. Morbid obesity:  BMI 44 4.  Uterine fibroids:  2 anterior fibroids seen during first trimester US--largest 5 cm and miduterus.  They were not definitively seen on follow up ultrasounds.  5.  Anemia: likely iron deficiency.  Hgb 9.2 at 34 weeks--received iron transfusion (feraheme) 2/15 and 2/22.    Past Medical History:  Diagnosis Date  . Obesity     Past Surgical History:  Procedure Laterality Date  . CESAREAN SECTION  03/19/2012   Procedure: CESAREAN SECTION;  Surgeon: Farrel Gobble. Harrington Challenger, MD;  Location: [facility];  Service: Gynecology;  Laterality: N/A;  . OTHER SURGICAL HISTORY  2012   R Tubal blockage  . TONSILLECTOMY     2 years ago    OB History  Gravida Para Term Preterm AB Living  3 1 1  0 1 1  SAB TAB Ectopic Multiple Live Births  1 0 0 0 1    # Outcome Date GA Lbr Len/2nd Weight Sex Delivery Anes PTL Lv  3 Current           2 Term 03/19/12 [redacted]w[redacted]d  3175 g M CS-Vac EPI  LIV  1 SAB             Social History   Socioeconomic History  . Marital status: Married    Spouse name: Not on file  . Number of children: Not on file  . Years of education: Not on file  . Highest education level: Not on file  Occupational History  . Not on file  Tobacco Use  . Smoking status: Never Smoker  .  Smokeless tobacco: Never Used  Substance and Sexual Activity  . Alcohol use: No  . Drug use: No  . Sexual activity: Yes    Birth control/protection: None  Other Topics Concern  . Not on file  Social History Narrative  . Not on file     Patient has no known allergies.    Prenatal Transfer Tool  Maternal Diabetes: No Genetic Screening: Declined Maternal Ultrasounds/Referrals: Normal Fetal Ultrasounds or other Referrals:  None Maternal Substance Abuse:  No Significant Maternal Medications:  Meds include: Other:  aspirin 81mg  and iron Significant Maternal Lab Results: None  ABO, Rh: --/--/O POS (02/26 1153) Antibody: NEG (02/26 1153) Rubella:  Immune RPR:   NR HBsAg:   Neg HIV:   Neg GBS:   Unknown    Vitals:   11/20/19 1146  BP: (!) 149/93  Pulse: (!) 109  Resp: 18  Temp: 98 F (36.7 C)  SpO2: 98%     General:  NAD Abdomen:  soft, gravid, non-tender Ext: 1+ edema / scds FHTs:  present    A/P   34 y.o. G3P1011 at [redacted]w[redacted]d presents for repeat cesarean section for gestational hypertension and oligohydramnios GHTN--BPs mild range, asymptomatic.  Will obtain  cmp / cbc Iron deficiency anemia--s/p ferraheme 2 weeks ago.  CBC pending.  Will T&C as needed Desires repeat cesarean section.  Discussed risks of cesarean section to include, but not limited to, infection, bleeding, damage to surrounding strutcures (including bowel, bladder, tubes, ovaries, nerves, vessels, baby), need for additional procedures, risk of blood clot, need for transfusion. Consent signed Ancef 3gm on call to [place]

## 2019-11-20 NOTE — Transfer of Care (Signed)
Immediate Anesthesia Transfer of Care Note  Patient: Miranda Huynh  Procedure(s) Performed: CESAREAN SECTION (N/A )  Patient Location: PACU  Anesthesia Type:Spinal  Level of Consciousness: awake  Airway & Oxygen Therapy: Patient Spontanous Breathing  Post-op Assessment: Report given to RN and Post -op Vital signs reviewed and stable  Post vital signs: Reviewed and stable  Last Vitals:  Vitals Value Taken Time  BP 124/79 11/20/19 1631  Temp 37.2 C 11/20/19 1631  Pulse 77 11/20/19 1631  Resp 19 11/20/19 1631  SpO2 99 % 11/20/19 1631    Last Pain:  Vitals:   11/20/19 1640  TempSrc:   PainSc: 7          Complications: No apparent anesthesia complications

## 2019-11-21 LAB — CBC
HCT: 29.5 % — ABNORMAL LOW (ref 36.0–46.0)
Hemoglobin: 8.8 g/dL — ABNORMAL LOW (ref 12.0–15.0)
MCH: 23.8 pg — ABNORMAL LOW (ref 26.0–34.0)
MCHC: 29.8 g/dL — ABNORMAL LOW (ref 30.0–36.0)
MCV: 79.7 fL — ABNORMAL LOW (ref 80.0–100.0)
Platelets: 289 10*3/uL (ref 150–400)
RBC: 3.7 MIL/uL — ABNORMAL LOW (ref 3.87–5.11)
RDW: 17.9 % — ABNORMAL HIGH (ref 11.5–15.5)
WBC: 6 10*3/uL (ref 4.0–10.5)
nRBC: 0 % (ref 0.0–0.2)

## 2019-11-21 LAB — RPR: RPR Ser Ql: NONREACTIVE

## 2019-11-21 NOTE — Progress Notes (Signed)
Patient is doing well.  She is tolerating PO, ambulating, voiding.  Pain is controlled.  Lochia is appropriate  Vitals:   11/20/19 1807 11/20/19 2205 11/21/19 0215 11/21/19 0527  BP: 127/81  131/70 109/60  Pulse: 85  78 75  Resp: 16 18 18 18   Temp: 98.4 F (36.9 C) 98.6 F (37 C) 98.6 F (37 C) 98.3 F (36.8 C)  TempSrc: Oral Oral Oral   SpO2: 99% 97% 99% 96%  Weight:      Height:        NAD Abdomen:  soft, appropriate tenderness, pressure dressing in place ext:    Symmetric, 2+ edema bilaterally, SCDs have been removed by patient  Lab Results  Component Value Date   WBC 6.0 11/21/2019   HGB 8.8 (L) 11/21/2019   HCT 29.5 (L) 11/21/2019   MCV 79.7 (L) 11/21/2019   PLT 289 11/21/2019    --/--/O POS, O POS Performed at [facility] [address] [place], [place] [address]  (02/26 1153)/RImmune  A/P    34 y.o. G3P1012 POD 2 s/p RCS for gestational hypertension and oligohydramnios Routine post op and postpartum care.   GHTN--BPs mild range since delivery.  Asymptomatic.  Will monitor closely H/o iron deficiency anemia--s/p ferraheme 2/15 and 2/22 Prophylaxis--BMI >40, lovenox ppx qd

## 2019-11-21 NOTE — Anesthesia Postprocedure Evaluation (Signed)
Anesthesia Post Note  Patient: Miranda Huynh  Procedure(s) Performed: CESAREAN SECTION (N/A )     Patient location during evaluation: PACU Anesthesia Type: Spinal Level of consciousness: oriented and awake and alert Pain management: pain level controlled Vital Signs Assessment: post-procedure vital signs reviewed and stable Respiratory status: spontaneous breathing, respiratory function stable and patient connected to nasal cannula oxygen Cardiovascular status: blood pressure returned to baseline and stable Postop Assessment: no headache, no backache and no apparent nausea or vomiting Anesthetic complications: no    Last Vitals:  Vitals:   11/21/19 0215 11/21/19 0527  BP: 131/70 109/60  Pulse: 78 75  Resp: 18 18  Temp: 37 C 36.8 C  SpO2: 99% 96%    Last Pain:  Vitals:   11/21/19 0522  TempSrc:   PainSc: 3    Pain Goal:                   Miranda Huynh S

## 2019-11-22 NOTE — Progress Notes (Signed)
Per Dr Carlis Abbott may change honey comb drsg

## 2019-11-22 NOTE — Progress Notes (Signed)
Patient is doing well.  She is tolerating PO, voiding. C/o gas pains--did not ambulate yesterday.  Pain is controlled when at rest.  Lochia is appropriate  Vitals:   11/21/19 0956 11/21/19 1600 11/21/19 2114 11/22/19 0524  BP: 120/70 123/77 139/73 123/75  Pulse: 77 87 89 81  Resp: 18 16 20 18   Temp: 98.8 F (37.1 C) 97.9 F (36.6 C) 97.6 F (36.4 C) 98.1 F (36.7 C)  TempSrc:   Oral Oral  SpO2: 99%  100%   Weight:      Height:        NAD Abdomen:  soft, appropriate tenderness, incision c/d/i with honeycomb in place ext:    Symmetric, 2+ edema bilaterally, +SCDs  Lab Results  Component Value Date   WBC 6.0 11/21/2019   HGB 8.8 (L) 11/21/2019   HCT 29.5 (L) 11/21/2019   MCV 79.7 (L) 11/21/2019   PLT 289 11/21/2019    --/--/O POS, O POS Performed at [facility] [address], [place], [place] [address]  (02/26 1153)/RImmune  A/P    34 y.o. G3P1012 POD 2 s/p RCS for gestational hypertension and oligohydramnios Routine post op and postpartum care.   Encouraged patient to ambulate today--is worried about pain with position change.  Reviewed importance of ambulation for healing and to avoid post-op complications GHTN--BPs mild range since delivery.  Asymptomatic.  Will monitor closely H/o iron deficiency anemia--s/p ferraheme 2/15 and 2/22 Prophylaxis--BMI >40, lovenox ppx qd

## 2019-11-23 MED ORDER — IBUPROFEN 600 MG PO TABS: 600.0000 mg | ORAL_TABLET | Freq: Four times a day (QID) | ORAL | 0 refills | Status: AC | PRN

## 2019-11-23 MED ORDER — OXYCODONE-ACETAMINOPHEN 5-325 MG PO TABS: 1.0000 | ORAL_TABLET | Freq: Four times a day (QID) | ORAL | 0 refills | Status: AC | PRN

## 2019-11-23 NOTE — Discharge Summary (Signed)
Obstetric Discharge Summary Reason for Admission: cesarean section Prenatal Procedures: NST and ultrasound Intrapartum Procedures: cesarean: low cervical, transverse Postpartum Procedures: none Complications-Operative and Postpartum: none Hemoglobin  Date Value Ref Range Status  11/21/2019 8.8 (L) 12.0 - 15.0 g/dL Final   HCT  Date Value Ref Range Status  11/21/2019 29.5 (L) 36.0 - 46.0 % Final    Physical Exam:  General: alert, cooperative and appears stated age 34: appropriate Uterine Fundus: firm Incision: healing well DVT Evaluation: No evidence of DVT seen on physical exam.  Discharge Diagnoses: Term Pregnancy-delivered  Discharge Information: Date: 11/23/2019 Activity: pelvic rest Diet: routine Medications: Ibuprofen and Percocet Condition: improved Instructions: refer to practice specific booklet Discharge to: home Follow-up Information    Jerelyn Charles, MD Follow up in 5 day(s).   Specialty: Obstetrics Why: 3-5 days foro blood pressure check 4 weeks for routine postpartum visit Contact information: [place] [address] [phone]           Newborn Data: Live born female  Birth Weight: 6 lb 2.2 oz (2784 g) APGAR: 8, 9  Newborn Delivery   Birth date/time: 11/20/2019 14:21:00 Delivery type: C-Section, Low Transverse Trial of labor: No C-section categorization: Repeat      Home with mother.  Vanessa Kick 11/23/2019, 8:21 AM

## 2019-11-24 ENCOUNTER — Other Ambulatory Visit: Payer: Self-pay

## 2019-11-24 ENCOUNTER — Encounter (HOSPITAL_COMMUNITY): Payer: Self-pay | Admitting: Obstetrics and Gynecology

## 2019-11-24 ENCOUNTER — Inpatient Hospital Stay (HOSPITAL_COMMUNITY)
Admission: AD | Admit: 2019-11-24 | Discharge: 2019-11-24 | Disposition: A | Discharge status: Home / Self Care | Payer: 59 | Attending: Obstetrics and Gynecology | Admitting: Obstetrics and Gynecology

## 2019-11-24 DIAGNOSIS — O99893 Other specified diseases and conditions complicating puerperium: Secondary | ICD-10-CM | POA: Insufficient documentation

## 2019-11-24 DIAGNOSIS — R030 Elevated blood-pressure reading, without diagnosis of hypertension: Secondary | ICD-10-CM | POA: Diagnosis present

## 2019-11-24 DIAGNOSIS — O133 Gestational [pregnancy-induced] hypertension without significant proteinuria, third trimester: Secondary | ICD-10-CM

## 2019-11-24 DIAGNOSIS — R519 Headache, unspecified: Secondary | ICD-10-CM | POA: Insufficient documentation

## 2019-11-24 LAB — COMPREHENSIVE METABOLIC PANEL
ALT: 25 U/L (ref 0–44)
AST: 32 U/L (ref 15–41)
Albumin: 2.6 g/dL — ABNORMAL LOW (ref 3.5–5.0)
Alkaline Phosphatase: 124 U/L (ref 38–126)
Anion gap: 8 (ref 5–15)
BUN: 11 mg/dL (ref 6–20)
CO2: 24 mmol/L (ref 22–32)
Calcium: 8.9 mg/dL (ref 8.9–10.3)
Chloride: 107 mmol/L (ref 98–111)
Creatinine, Ser: 0.81 mg/dL (ref 0.44–1.00)
GFR calc Af Amer: >60 mL/min (ref >60)
GFR calc non Af Amer: >60 mL/min (ref >60)
Glucose, Bld: 82 mg/dL (ref 70–99)
Potassium: 3.9 mmol/L (ref 3.5–5.1)
Sodium: 139 mmol/L (ref 135–145)
Total Bilirubin: 0.5 mg/dL (ref 0.3–1.2)
Total Protein: 6.3 g/dL — ABNORMAL LOW (ref 6.5–8.1)

## 2019-11-24 LAB — CBC
HCT: 30.8 % — ABNORMAL LOW (ref 36.0–46.0)
Hemoglobin: 9.4 g/dL — ABNORMAL LOW (ref 12.0–15.0)
MCH: 24.1 pg — ABNORMAL LOW (ref 26.0–34.0)
MCHC: 30.5 g/dL (ref 30.0–36.0)
MCV: 79 fL — ABNORMAL LOW (ref 80.0–100.0)
Platelets: 358 10*3/uL (ref 150–400)
RBC: 3.9 MIL/uL (ref 3.87–5.11)
RDW: 18.2 % — ABNORMAL HIGH (ref 11.5–15.5)
WBC: 4.4 10*3/uL (ref 4.0–10.5)
nRBC: 0 % (ref 0.0–0.2)

## 2019-11-24 MED ORDER — ACETAMINOPHEN 500 MG PO TABS
1000.0000 mg | ORAL_TABLET | Freq: Once | ORAL | Status: AC
  Administered 2019-11-24: 1000 mg via ORAL
  Filled 2019-11-24: qty 2

## 2019-11-24 NOTE — MAU Provider Note (Signed)
History     CSN: [id number]  Arrival date and time: 11/24/19 1424   First Provider Initiated Contact with Patient 11/24/19 1512     Chief Complaint  Patient presents with  . Hypertension  . Headache   HPI Miranda Huynh is a 34 y.o. [id number] postpartum patient who presents to MAU for evaluation of elevated blood pressures on her home cuff. She endorses readings of 180/100 when she woke up this morning followed by a second reading of 167/95. She is s/p repeat cesarean on 11/20/2019 with hospital discharge yesterday 11/23/2019. Her pregnancy was complicated by oligohydramnios and Gestational Hypertension. She states she was instructed to "frequently" check her blood pressure at home.  Patient also c/o mild headache, new onset today. Her pain is anterior, bilateral, and rated as 3/10. She denies aggravating or alleviating factors. She has not taken medication or tried other treatments for this complaint.   She denies visual disturbances, RUQ/epigastric pain, new onset swelling or weight gain. She is exclusively bottle feeding.  She receives care with [facility] and does not have any postpartum appointments scheduled yet.  OB History    Gravida  3   Para  2   Term  1   Preterm  0   AB  1   Living  2     SAB  1   TAB  0   Ectopic  0   Multiple  0   Live Births  2           Past Medical History:  Diagnosis Date  . Obesity     Past Surgical History:  Procedure Laterality Date  . CESAREAN SECTION  03/19/2012   Procedure: CESAREAN SECTION;  Surgeon: Farrel Gobble. Harrington Challenger, MD;  Location: [facility];  Service: Gynecology;  Laterality: N/A;  . CESAREAN SECTION N/A 11/20/2019   Procedure: CESAREAN SECTION;  Surgeon: Jerelyn Charles, MD;  Location: [facility];  Service: Obstetrics;  Laterality: N/A;  Heather, RNFA  . OTHER SURGICAL HISTORY  2012   R Tubal blockage  . TONSILLECTOMY     2 years ago    Family History  Problem Relation Age of Onset  . Cancer Maternal Grandmother    . Anesthesia problems Neg Hx     Social History   Tobacco Use  . Smoking status: Never Smoker  . Smokeless tobacco: Never Used  Substance Use Topics  . Alcohol use: No  . Drug use: No    Allergies: No Known Allergies  Medications Prior to Admission  Medication Sig Dispense Refill Last Dose  . ibuprofen (ADVIL) 600 MG tablet Take 1 tablet (600 mg total) by mouth every 6 (six) hours as needed. 90 tablet 0 11/24/2019 at Unknown time  . oxyCODONE-acetaminophen (PERCOCET/ROXICET) 5-325 MG tablet Take 1-2 tablets by mouth every 6 (six) hours as needed for severe pain. 30 tablet 0 11/23/2019 at Unknown time    Review of Systems  Constitutional: Negative for chills, fatigue and fever.  Eyes: Negative for photophobia and visual disturbance.  Respiratory: Negative for shortness of breath.   Gastrointestinal: Negative for abdominal pain.  Genitourinary: Negative for dysuria, vaginal bleeding, vaginal discharge and vaginal pain.  Musculoskeletal: Negative for back pain.  Neurological: Positive for headaches. Negative for syncope and weakness.  All other systems reviewed and are negative.  Physical Exam   Blood pressure 120/84, pulse 91, temperature 98.4 F (36.9 C), temperature source Oral, resp. rate 18, SpO2 100 %, not currently breastfeeding.  Physical Exam  Nursing note  and vitals reviewed. Constitutional: She is oriented to person, place, and time. She appears well-developed and well-nourished.  Cardiovascular: Normal rate and normal heart sounds.  Respiratory: Effort normal and breath sounds normal.  GI: Soft. Bowel sounds are normal. She exhibits no distension and no mass. There is no abdominal tenderness. There is no rebound and no guarding.  Honeycomb dressing clean, dry and intact  Musculoskeletal:        General: Normal range of motion.  Neurological: She is alert and oriented to person, place, and time.  Skin: Skin is warm and dry.  Psychiatric: She has a normal mood and  affect. Her behavior is normal. Judgment and thought content normal.    MAU Course/MDM  Procedures  --Elevated BP x 1 in MAU. Not severe range and no severe symptoms --Headache resolved with PO Tylenol --Discussed with Dr. Harolyn Rutherford, who agrees with plan for discharge, blood pressure check in two days  Patient Vitals for the past 24 hrs:  BP Temp Temp src Pulse Resp SpO2  11/24/19 1631 123/78 - - 84 - -  11/24/19 1615 123/74 - - 88 - 100 %  11/24/19 1601 127/77 - - 88 - -  11/24/19 1545 123/75 - - 92 - 99 %  11/24/19 1530 123/74 - - 87 - 100 %  11/24/19 1508 120/84 - - 91 - -  11/24/19 1451 (!) 144/91 98.4 F (36.9 C) Oral (!) 106 18 100 %   Results for orders placed or performed during the hospital encounter of 11/24/19 (from the past 24 hour(s))  CBC     Status: Abnormal   Collection Time: 11/24/19  3:17 PM  Result Value Ref Range   WBC 4.4 4.0 - 10.5 K/uL   RBC 3.90 3.87 - 5.11 MIL/uL   Hemoglobin 9.4 (L) 12.0 - 15.0 g/dL   HCT 30.8 (L) 36.0 - 46.0 %   MCV 79.0 (L) 80.0 - 100.0 fL   MCH 24.1 (L) 26.0 - 34.0 pg   MCHC 30.5 30.0 - 36.0 g/dL   RDW 18.2 (H) 11.5 - 15.5 %   Platelets 358 150 - 400 K/uL   nRBC 0.0 0.0 - 0.2 %  Comprehensive metabolic panel     Status: Abnormal   Collection Time: 11/24/19  3:17 PM  Result Value Ref Range   Sodium 139 135 - 145 mmol/L   Potassium 3.9 3.5 - 5.1 mmol/L   Chloride 107 98 - 111 mmol/L   CO2 24 22 - 32 mmol/L   Glucose, Bld 82 70 - 99 mg/dL   BUN 11 6 - 20 mg/dL   Creatinine, Ser 0.81 0.44 - 1.00 mg/dL   Calcium 8.9 8.9 - 10.3 mg/dL   Total Protein 6.3 (L) 6.5 - 8.1 g/dL   Albumin 2.6 (L) 3.5 - 5.0 g/dL   AST 32 15 - 41 U/L   ALT 25 0 - 44 U/L   Alkaline Phosphatase 124 38 - 126 U/L   Total Bilirubin 0.5 0.3 - 1.2 mg/dL   GFR calc non Af Amer >60 >60 mL/min   GFR calc Af Amer >60 >60 mL/min   Anion gap 8 5 - 15   Assessment and Plan  --34 y.o. [id number] post-op patient --Elevated BP x 1, normal PEC labs --Headache  resolved with interventions in MAU --Discharge home in stable condition   F/U: --Per Dr. Harolyn Rutherford, bp check no later than Thursday 11/26/2019 --Discussed with front desk staff at [facility], who agreed to call patient to schedule  appointment  Darlina Rumpf, CNM 11/24/2019, 5:57 PM

## 2019-11-24 NOTE — MAU Note (Signed)
Had c/s last Friday, was moved up because of elevated BP.  D/c'd yesterday.  Was told to check BP frequently.  Was 180/100 today, noted increase in swelling in feet/ ankles.  Instructed to come and meet dr here.  Slight HA, denies epigastric pain or visual changes.

## 2019-11-24 NOTE — Discharge Instructions (Signed)
Postpartum Hypertension Postpartum hypertension is high blood pressure that remains higher than normal after childbirth. You may not realize that you have postpartum hypertension if your blood pressure is not being checked regularly. In most cases, postpartum hypertension will go away on its own, usually within a week of delivery. However, for some women, medical treatment is required to prevent serious complications, such as seizures or stroke. What are the causes? This condition may be caused by one or more of the following:  Hypertension that existed before pregnancy (chronic hypertension).  Hypertension that comes on as a result of pregnancy (gestational hypertension).  Hypertensive disorders during pregnancy (preeclampsia) or seizures in women who have high blood pressure during pregnancy (eclampsia).  A condition in which the liver, platelets, and red blood cells are damaged during pregnancy (HELLP syndrome).  A condition in which the thyroid produces too much hormones (hyperthyroidism).  Other rare problems of the nerves (neurological disorders) or blood disorders. In some cases, the cause may not be known. What increases the risk? The following factors may make you more likely to develop this condition:  Chronic hypertension. In some cases, this may not have been diagnosed before pregnancy.  Obesity.  Type 2 diabetes.  Kidney disease.  History of preeclampsia or eclampsia.  Other medical conditions that change the level of hormones in the body (hormonal imbalance). What are the signs or symptoms? As with all types of hypertension, postpartum hypertension may not have any symptoms. Depending on how high your blood pressure is, you may experience:  Headaches. These may be mild, moderate, or severe. They may also be steady, constant, or sudden in onset (thunderclap headache).  Changes in your ability to see (visual changes).  Dizziness.  Shortness of breath.  Swelling  of your hands, feet, lower legs, or face. In some cases, you may have swelling in more than one of these locations.  Heart palpitations or a racing heartbeat.  Difficulty breathing while lying down.  Decrease in the amount of urine that you pass. Other rare signs and symptoms may include:  Sweating more than usual. This lasts longer than a few days after delivery.  Chest pain.  Sudden dizziness when you get up from sitting or lying down.  Seizures.  Nausea or vomiting.  Abdominal pain. How is this diagnosed? This condition may be diagnosed based on the results of a physical exam, blood pressure measurements, and blood and urine tests. You may also have other tests, such as a CT scan or an MRI, to check for other problems of postpartum hypertension. How is this treated? If blood pressure is high enough to require treatment, your options may include:  Medicines to reduce blood pressure (antihypertensives). Tell your health care provider if you are breastfeeding or if you plan to breastfeed. There are many antihypertensive medicines that are safe to take while breastfeeding.  Stopping medicines that may be causing hypertension.  Treating medical conditions that are causing hypertension.  Treating the complications of hypertension, such as seizures, stroke, or kidney problems. Your health care provider will also continue to monitor your blood pressure closely until it is within a safe range for you. Follow these instructions at home:  Take over-the-counter and prescription medicines only as told by your health care provider.  Return to your normal activities as told by your health care provider. Ask your health care provider what activities are safe for you.  Do not use any products that contain nicotine or tobacco, such as cigarettes and e-cigarettes. If   you need help quitting, ask your health care provider.  Keep all follow-up visits as told by your health care provider. This  is important. Contact a health care provider if:  Your symptoms get worse.  You have new symptoms, such as: ? A headache that does not get better. ? Dizziness. ? Visual changes. Get help right away if:  You suddenly develop swelling in your hands, ankles, or face.  You have sudden, rapid weight gain.  You develop difficulty breathing, chest pain, racing heartbeat, or heart palpitations.  You develop severe pain in your abdomen.  You have any symptoms of a stroke. "BE FAST" is an easy way to remember the main warning signs of a stroke: ? B - Balance. Signs are dizziness, sudden trouble walking, or loss of balance. ? E - Eyes. Signs are trouble seeing or a sudden change in vision. ? F - Face. Signs are sudden weakness or numbness of the face, or the face or eyelid drooping on one side. ? A - Arms. Signs are weakness or numbness in an arm. This happens suddenly and usually on one side of the body. ? S - Speech. Signs are sudden trouble speaking, slurred speech, or trouble understanding what people say. ? T - Time. Time to call emergency services. Write down what time symptoms started.  You have other signs of a stroke, such as: ? A sudden, severe headache with no known cause. ? Nausea or vomiting. ? Seizure. These symptoms may represent a serious problem that is an emergency. Do not wait to see if the symptoms will go away. Get medical help right away. Call your local emergency services (911 in the [place] Do not drive yourself to the hospital. Summary  Postpartum hypertension is high blood pressure that remains higher than normal after childbirth.  In most cases, postpartum hypertension will go away on its own, usually within a week of delivery.  For some women, medical treatment is required to prevent serious complications, such as seizures or stroke. This information is not intended to replace advice given to you by your health care provider. Make sure you discuss any questions  you have with your health care provider. Document Revised: 10/17/2018 Document Reviewed: 07/01/2017 [employer] Patient Education  2020 [employer]  

## 2022-08-22 ENCOUNTER — Other Ambulatory Visit: Payer: Self-pay | Admitting: Internal Medicine

## 2022-08-23 LAB — COMPLETE METABOLIC PANEL WITH GFR
AG Ratio: 1.1 (calc) (ref 1.0–2.5)
ALT: 9 U/L (ref 6–29)
AST: 14 U/L (ref 10–30)
Albumin: 4.1 g/dL (ref 3.6–5.1)
Alkaline phosphatase (APISO): 119 U/L (ref 31–125)
BUN: 16 mg/dL (ref 7–25)
CO2: 22 mmol/L (ref 20–32)
Calcium: 9.4 mg/dL (ref 8.6–10.2)
Chloride: 104 mmol/L (ref 98–110)
Creat: 0.79 mg/dL (ref 0.50–0.97)
Globulin: 3.9 g/dL (calc) — ABNORMAL HIGH (ref 1.9–3.7)
Glucose, Bld: 87 mg/dL (ref 65–99)
Potassium: 4.3 mmol/L (ref 3.5–5.3)
Sodium: 137 mmol/L (ref 135–146)
Total Bilirubin: 0.3 mg/dL (ref 0.2–1.2)
Total Protein: 8 g/dL (ref 6.1–8.1)
eGFR: 98 mL/min/{1.73_m2} (ref >=60)

## 2022-08-23 LAB — LIPID PANEL
Cholesterol: 185 mg/dL (ref <200)
HDL: 59 mg/dL (ref >=50)
LDL Cholesterol (Calc): 110 mg/dL (calc) — ABNORMAL HIGH
Non-HDL Cholesterol (Calc): 126 mg/dL (calc) (ref <130)
Total CHOL/HDL Ratio: 3.1 (calc) (ref <5.0)
Triglycerides: 71 mg/dL (ref <150)

## 2022-08-23 LAB — CBC
HCT: 36.4 % (ref 35.0–45.0)
Hemoglobin: 11.3 g/dL — ABNORMAL LOW (ref 11.7–15.5)
MCH: 23.6 pg — ABNORMAL LOW (ref 27.0–33.0)
MCHC: 31 g/dL — ABNORMAL LOW (ref 32.0–36.0)
MCV: 76 fL — ABNORMAL LOW (ref 80.0–100.0)
MPV: 11.2 fL (ref 7.5–12.5)
Platelets: 360 10*3/uL (ref 140–400)
RBC: 4.79 10*6/uL (ref 3.80–5.10)
RDW: 15.3 % — ABNORMAL HIGH (ref 11.0–15.0)
WBC: 4.3 10*3/uL (ref 3.8–10.8)

## 2022-08-23 LAB — TSH: TSH: 1.01 mIU/L

## 2022-08-23 LAB — VITAMIN D 25 HYDROXY (VIT D DEFICIENCY, FRACTURES): Vit D, 25-Hydroxy: 10 ng/mL — ABNORMAL LOW (ref 30–100)
